# Patient Record
Sex: Female | Born: 1940 | Race: White | Hispanic: No | Smoking: Former smoker
Health system: Southern US, Community
[De-identification: ages and names within clinical notes are randomized; demographics above are authoritative.]

## PROBLEM LIST (undated history)

## (undated) DIAGNOSIS — G2581 Restless legs syndrome: Secondary | ICD-10-CM

## (undated) DIAGNOSIS — I509 Heart failure, unspecified: Secondary | ICD-10-CM

## (undated) DIAGNOSIS — N200 Calculus of kidney: Secondary | ICD-10-CM

## (undated) DIAGNOSIS — I219 Acute myocardial infarction, unspecified: Secondary | ICD-10-CM

## (undated) DIAGNOSIS — M79606 Pain in leg, unspecified: Secondary | ICD-10-CM

## (undated) DIAGNOSIS — I252 Old myocardial infarction: Secondary | ICD-10-CM

## (undated) DIAGNOSIS — I6930 Unspecified sequelae of cerebral infarction: Secondary | ICD-10-CM

## (undated) DIAGNOSIS — I10 Essential (primary) hypertension: Secondary | ICD-10-CM

## (undated) DIAGNOSIS — Z8489 Family history of other specified conditions: Secondary | ICD-10-CM

## (undated) DIAGNOSIS — G8191 Hemiplegia, unspecified affecting right dominant side: Secondary | ICD-10-CM

## (undated) DIAGNOSIS — G8929 Other chronic pain: Secondary | ICD-10-CM

## (undated) DIAGNOSIS — Z955 Presence of coronary angioplasty implant and graft: Secondary | ICD-10-CM

## (undated) DIAGNOSIS — Z87442 Personal history of urinary calculi: Secondary | ICD-10-CM

## (undated) DIAGNOSIS — E119 Type 2 diabetes mellitus without complications: Secondary | ICD-10-CM

## (undated) DIAGNOSIS — R918 Other nonspecific abnormal finding of lung field: Secondary | ICD-10-CM

## (undated) DIAGNOSIS — Z973 Presence of spectacles and contact lenses: Secondary | ICD-10-CM

## (undated) DIAGNOSIS — E782 Mixed hyperlipidemia: Secondary | ICD-10-CM

## (undated) DIAGNOSIS — I639 Cerebral infarction, unspecified: Secondary | ICD-10-CM

## (undated) DIAGNOSIS — G4700 Insomnia, unspecified: Secondary | ICD-10-CM

## (undated) DIAGNOSIS — I2510 Atherosclerotic heart disease of native coronary artery without angina pectoris: Secondary | ICD-10-CM

## (undated) DIAGNOSIS — N2889 Other specified disorders of kidney and ureter: Secondary | ICD-10-CM

## (undated) DIAGNOSIS — M1990 Unspecified osteoarthritis, unspecified site: Secondary | ICD-10-CM

## (undated) DIAGNOSIS — J449 Chronic obstructive pulmonary disease, unspecified: Secondary | ICD-10-CM

## (undated) HISTORY — PX: CARPAL TUNNEL RELEASE: SHX101

## (undated) HISTORY — PX: FRACTURE SURGERY: SHX138

## (undated) HISTORY — PX: DILATION AND CURETTAGE OF UTERUS: SHX78

## (undated) HISTORY — PX: CORONARY ANGIOPLASTY WITH STENT PLACEMENT: SHX49

## (undated) HISTORY — PX: TUBAL LIGATION: SHX77

## (undated) HISTORY — PX: CATARACT EXTRACTION W/ INTRAOCULAR LENS  IMPLANT, BILATERAL: SHX1307

---

## 1962-05-27 HISTORY — PX: CHOLECYSTECTOMY OPEN: SUR202

## 1980-05-27 HISTORY — PX: ABDOMINAL HYSTERECTOMY: SHX81

## 1989-05-27 HISTORY — PX: ORIF ANKLE FRACTURE: SHX5408

## 1997-09-07 ENCOUNTER — Other Ambulatory Visit: Admission: RE | Admit: 1997-09-07 | Discharge: 1997-09-07 | Payer: Self-pay | Admitting: Internal Medicine

## 1997-12-09 ENCOUNTER — Other Ambulatory Visit: Admission: RE | Admit: 1997-12-09 | Discharge: 1997-12-09 | Payer: Self-pay | Admitting: Unknown Physician Specialty

## 1998-12-01 ENCOUNTER — Other Ambulatory Visit: Admission: RE | Admit: 1998-12-01 | Discharge: 1998-12-01 | Payer: Self-pay | Admitting: Internal Medicine

## 1999-05-28 DIAGNOSIS — I252 Old myocardial infarction: Secondary | ICD-10-CM

## 1999-05-28 DIAGNOSIS — I6930 Unspecified sequelae of cerebral infarction: Secondary | ICD-10-CM

## 1999-05-28 DIAGNOSIS — I219 Acute myocardial infarction, unspecified: Secondary | ICD-10-CM

## 1999-05-28 HISTORY — DX: Acute myocardial infarction, unspecified: I21.9

## 1999-05-28 HISTORY — DX: Old myocardial infarction: I25.2

## 1999-05-28 HISTORY — DX: Unspecified sequelae of cerebral infarction: I69.30

## 2000-05-27 DIAGNOSIS — I639 Cerebral infarction, unspecified: Secondary | ICD-10-CM

## 2000-05-27 HISTORY — DX: Cerebral infarction, unspecified: I63.9

## 2000-08-17 ENCOUNTER — Encounter: Payer: Self-pay | Admitting: *Deleted

## 2000-08-17 ENCOUNTER — Inpatient Hospital Stay (HOSPITAL_COMMUNITY): Admission: EM | Admit: 2000-08-17 | Discharge: 2000-08-19 | Payer: Self-pay

## 2000-12-26 ENCOUNTER — Encounter: Payer: Self-pay | Admitting: Orthopedic Surgery

## 2000-12-26 ENCOUNTER — Encounter: Admission: RE | Admit: 2000-12-26 | Discharge: 2000-12-26 | Payer: Self-pay | Admitting: Orthopedic Surgery

## 2001-02-11 ENCOUNTER — Encounter: Admission: RE | Admit: 2001-02-11 | Discharge: 2001-05-12 | Payer: Self-pay | Admitting: Orthopedic Surgery

## 2001-03-12 ENCOUNTER — Other Ambulatory Visit: Admission: RE | Admit: 2001-03-12 | Discharge: 2001-03-12 | Payer: Self-pay | Admitting: Family Medicine

## 2001-03-16 ENCOUNTER — Encounter: Payer: Self-pay | Admitting: Family Medicine

## 2001-03-16 ENCOUNTER — Encounter: Admission: RE | Admit: 2001-03-16 | Discharge: 2001-03-16 | Payer: Self-pay | Admitting: Family Medicine

## 2001-04-05 ENCOUNTER — Encounter: Payer: Self-pay | Admitting: Emergency Medicine

## 2001-04-05 ENCOUNTER — Inpatient Hospital Stay (HOSPITAL_COMMUNITY): Admission: EM | Admit: 2001-04-05 | Discharge: 2001-04-15 | Payer: Self-pay | Admitting: Emergency Medicine

## 2001-04-06 ENCOUNTER — Encounter: Payer: Self-pay | Admitting: Internal Medicine

## 2001-04-07 ENCOUNTER — Encounter: Payer: Self-pay | Admitting: Internal Medicine

## 2001-04-08 ENCOUNTER — Encounter: Payer: Self-pay | Admitting: Internal Medicine

## 2001-05-12 ENCOUNTER — Ambulatory Visit (HOSPITAL_COMMUNITY): Admission: RE | Admit: 2001-05-12 | Discharge: 2001-05-12 | Payer: Self-pay | Admitting: Internal Medicine

## 2001-05-12 ENCOUNTER — Encounter: Payer: Self-pay | Admitting: Internal Medicine

## 2002-01-16 ENCOUNTER — Encounter: Payer: Self-pay | Admitting: Emergency Medicine

## 2002-01-16 ENCOUNTER — Emergency Department (HOSPITAL_COMMUNITY): Admission: EM | Admit: 2002-01-16 | Discharge: 2002-01-16 | Payer: Self-pay | Admitting: Emergency Medicine

## 2002-05-17 ENCOUNTER — Emergency Department (HOSPITAL_COMMUNITY): Admission: EM | Admit: 2002-05-17 | Discharge: 2002-05-17 | Payer: Self-pay | Admitting: Emergency Medicine

## 2002-05-17 ENCOUNTER — Encounter: Payer: Self-pay | Admitting: *Deleted

## 2015-09-25 ENCOUNTER — Institutional Professional Consult (permissible substitution): Payer: Self-pay | Admitting: Pulmonary Disease

## 2015-10-18 ENCOUNTER — Emergency Department (HOSPITAL_COMMUNITY): Payer: Medicare Other

## 2015-10-18 ENCOUNTER — Encounter (HOSPITAL_COMMUNITY): Payer: Self-pay | Admitting: *Deleted

## 2015-10-18 ENCOUNTER — Emergency Department (HOSPITAL_COMMUNITY)
Admission: EM | Admit: 2015-10-18 | Discharge: 2015-10-18 | Disposition: A | Discharge status: Home / Self Care | Payer: Medicare Other | Attending: Emergency Medicine | Admitting: Emergency Medicine

## 2015-10-18 DIAGNOSIS — N201 Calculus of ureter: Secondary | ICD-10-CM | POA: Insufficient documentation

## 2015-10-18 DIAGNOSIS — F329 Major depressive disorder, single episode, unspecified: Secondary | ICD-10-CM | POA: Insufficient documentation

## 2015-10-18 DIAGNOSIS — I10 Essential (primary) hypertension: Secondary | ICD-10-CM | POA: Insufficient documentation

## 2015-10-18 DIAGNOSIS — J449 Chronic obstructive pulmonary disease, unspecified: Secondary | ICD-10-CM | POA: Diagnosis not present

## 2015-10-18 DIAGNOSIS — Z7984 Long term (current) use of oral hypoglycemic drugs: Secondary | ICD-10-CM | POA: Insufficient documentation

## 2015-10-18 DIAGNOSIS — I252 Old myocardial infarction: Secondary | ICD-10-CM | POA: Insufficient documentation

## 2015-10-18 DIAGNOSIS — Z91040 Latex allergy status: Secondary | ICD-10-CM | POA: Diagnosis not present

## 2015-10-18 DIAGNOSIS — Z7982 Long term (current) use of aspirin: Secondary | ICD-10-CM | POA: Insufficient documentation

## 2015-10-18 DIAGNOSIS — E119 Type 2 diabetes mellitus without complications: Secondary | ICD-10-CM | POA: Diagnosis not present

## 2015-10-18 DIAGNOSIS — Z8673 Personal history of transient ischemic attack (TIA), and cerebral infarction without residual deficits: Secondary | ICD-10-CM | POA: Insufficient documentation

## 2015-10-18 DIAGNOSIS — R1031 Right lower quadrant pain: Secondary | ICD-10-CM | POA: Diagnosis present

## 2015-10-18 DIAGNOSIS — I2510 Atherosclerotic heart disease of native coronary artery without angina pectoris: Secondary | ICD-10-CM | POA: Insufficient documentation

## 2015-10-18 HISTORY — DX: Cerebral infarction, unspecified: I63.9

## 2015-10-18 HISTORY — DX: Atherosclerotic heart disease of native coronary artery without angina pectoris: I25.10

## 2015-10-18 HISTORY — DX: Chronic obstructive pulmonary disease, unspecified: J44.9

## 2015-10-18 HISTORY — DX: Insomnia, unspecified: G47.00

## 2015-10-18 HISTORY — DX: Essential (primary) hypertension: I10

## 2015-10-18 LAB — I-STAT CHEM 8, ED
BUN: 20 mg/dL (ref 6–20)
CREATININE: 0.8 mg/dL (ref 0.44–1.00)
Calcium, Ion: 1.13 mmol/L (ref 1.13–1.30)
Chloride: 108 mmol/L (ref 101–111)
Glucose, Bld: 194 mg/dL — ABNORMAL HIGH (ref 65–99)
HEMATOCRIT: 36 % (ref 36.0–46.0)
HEMOGLOBIN: 12.2 g/dL (ref 12.0–15.0)
POTASSIUM: 3.8 mmol/L (ref 3.5–5.1)
SODIUM: 142 mmol/L (ref 135–145)
TCO2: 22 mmol/L (ref 0–100)

## 2015-10-18 LAB — URINALYSIS, ROUTINE W REFLEX MICROSCOPIC
Bilirubin Urine: NEGATIVE
GLUCOSE, UA: 100 mg/dL — AB
KETONES UR: NEGATIVE mg/dL
LEUKOCYTES UA: NEGATIVE
NITRITE: NEGATIVE
PH: 5.5 (ref 5.0–8.0)
Protein, ur: NEGATIVE mg/dL
Specific Gravity, Urine: 1.014 (ref 1.005–1.030)

## 2015-10-18 LAB — CBC WITH DIFFERENTIAL/PLATELET
BASOS ABS: 0 10*3/uL (ref 0.0–0.1)
BASOS PCT: 1 %
Eosinophils Absolute: 0.1 10*3/uL (ref 0.0–0.7)
Eosinophils Relative: 1 %
HEMATOCRIT: 35.6 % — AB (ref 36.0–46.0)
HEMOGLOBIN: 11.6 g/dL — AB (ref 12.0–15.0)
LYMPHS PCT: 25 %
Lymphs Abs: 1.5 10*3/uL (ref 0.7–4.0)
MCH: 28.3 pg (ref 26.0–34.0)
MCHC: 32.6 g/dL (ref 30.0–36.0)
MCV: 86.8 fL (ref 78.0–100.0)
Monocytes Absolute: 0.5 10*3/uL (ref 0.1–1.0)
Monocytes Relative: 8 %
NEUTROS ABS: 4.1 10*3/uL (ref 1.7–7.7)
NEUTROS PCT: 65 %
Platelets: 217 10*3/uL (ref 150–400)
RBC: 4.1 MIL/uL (ref 3.87–5.11)
RDW: 13.5 % (ref 11.5–15.5)
WBC: 6.2 10*3/uL (ref 4.0–10.5)

## 2015-10-18 LAB — URINE MICROSCOPIC-ADD ON

## 2015-10-18 LAB — CBG MONITORING, ED: Glucose-Capillary: 180 mg/dL — ABNORMAL HIGH (ref 65–99)

## 2015-10-18 MED ORDER — HYDROMORPHONE HCL 1 MG/ML IJ SOLN
1.0000 mg | Freq: Once | INTRAMUSCULAR | Status: AC
  Administered 2015-10-18: 1 mg via INTRAVENOUS
  Filled 2015-10-18: qty 1

## 2015-10-18 MED ORDER — HYDROMORPHONE HCL 1 MG/ML IJ SOLN
0.5000 mg | Freq: Once | INTRAMUSCULAR | Status: AC
  Administered 2015-10-18: 0.5 mg via INTRAVENOUS
  Filled 2015-10-18: qty 1

## 2015-10-18 MED ORDER — FENTANYL CITRATE (PF) 100 MCG/2ML IJ SOLN
100.0000 ug | Freq: Once | INTRAMUSCULAR | Status: AC
  Administered 2015-10-18: 100 ug via INTRAVENOUS
  Filled 2015-10-18: qty 2

## 2015-10-18 MED ORDER — KETOROLAC TROMETHAMINE 30 MG/ML IJ SOLN
15.0000 mg | Freq: Once | INTRAMUSCULAR | Status: AC
  Administered 2015-10-18: 15 mg via INTRAVENOUS
  Filled 2015-10-18: qty 1

## 2015-10-18 MED ORDER — ONDANSETRON HCL 4 MG/2ML IJ SOLN
4.0000 mg | Freq: Once | INTRAMUSCULAR | Status: AC
  Administered 2015-10-18: 4 mg via INTRAVENOUS
  Filled 2015-10-18: qty 2

## 2015-10-18 MED ORDER — OXYCODONE-ACETAMINOPHEN 5-325 MG PO TABS: 1.0000 | ORAL_TABLET | Freq: Four times a day (QID) | ORAL | Status: DC | PRN

## 2015-10-18 MED ORDER — IOPAMIDOL (ISOVUE-300) INJECTION 61%
100.0000 mL | Freq: Once | INTRAVENOUS | Status: AC | PRN
  Administered 2015-10-18: 100 mL via INTRAVENOUS

## 2015-10-18 MED ORDER — SODIUM CHLORIDE 0.9 % IV BOLUS (SEPSIS)
500.0000 mL | Freq: Once | INTRAVENOUS | Status: AC
  Administered 2015-10-18: 500 mL via INTRAVENOUS

## 2015-10-18 NOTE — ED Notes (Signed)
Bed: [id number] Expected date:  Expected time:  Means of arrival:  Comments: EMS 75 yo female abdominal and back pain/pain meds 100 mcg Fentanyl

## 2015-10-18 NOTE — Discharge Instructions (Signed)
Kidney Stones °Kidney stones (urolithiasis) are deposits that form inside your kidneys. The intense pain is caused by the stone moving through the urinary tract. When the stone moves, the ureter goes into spasm around the stone. The stone is usually passed in the urine.  °CAUSES  °· A disorder that makes certain neck glands produce too much parathyroid hormone (primary hyperparathyroidism). °· A buildup of uric acid crystals, similar to gout in your joints. °· Narrowing (stricture) of the ureter. °· A kidney obstruction present at birth (congenital obstruction). °· Previous surgery on the kidney or ureters. °· Numerous kidney infections. °SYMPTOMS  °· Feeling sick to your stomach (nauseous). °· Throwing up (vomiting). °· Blood in the urine (hematuria). °· Pain that usually spreads (radiates) to the groin. °· Frequency or urgency of urination. °DIAGNOSIS  °· Taking a history and physical exam. °· Blood or urine tests. °· CT scan. °· Occasionally, an examination of the inside of the urinary bladder (cystoscopy) is performed. °TREATMENT  °· Observation. °· Increasing your fluid intake. °· Extracorporeal shock wave lithotripsy--This is a noninvasive procedure that uses shock waves to break up kidney stones. °· Surgery may be needed if you have severe pain or persistent obstruction. There are various surgical procedures. Most of the procedures are performed with the use of small instruments. Only small incisions are needed to accommodate these instruments, so recovery time is minimized. °The size, location, and chemical composition are all important variables that will determine the proper choice of action for you. Talk to your health care provider to better understand your situation so that you will minimize the risk of injury to yourself and your kidney.  °HOME CARE INSTRUCTIONS  °· Drink enough water and fluids to keep your urine clear or pale yellow. This will help you to pass the stone or stone fragments. °· Strain  all urine through the provided strainer. Keep all particulate matter and stones for your health care provider to see. The stone causing the pain may be as small as a grain of salt. It is very important to use the strainer each and every time you pass your urine. The collection of your stone will allow your health care provider to analyze it and verify that a stone has actually passed. The stone analysis will often identify what you can do to reduce the incidence of recurrences. °· Only take over-the-counter or prescription medicines for pain, discomfort, or fever as directed by your health care provider. °· Keep all follow-up visits as told by your health care provider. This is important. °· Get follow-up X-rays if required. The absence of pain does not always mean that the stone has passed. It may have only stopped moving. If the urine remains completely obstructed, it can cause loss of kidney function or even complete destruction of the kidney. It is your responsibility to make sure X-rays and follow-ups are completed. Ultrasounds of the kidney can show blockages and the status of the kidney. Ultrasounds are not associated with any radiation and can be performed easily in a matter of minutes. °· Make changes to your daily diet as told by your health care provider. You may be told to: °¨ Limit the amount of salt that you eat. °¨ Eat 5 or more servings of fruits and vegetables each day. °¨ Limit the amount of meat, poultry, fish, and eggs that you eat. °· Collect a 24-hour urine sample as told by your health care provider. You may need to collect another urine sample every 6-12   months. °SEEK MEDICAL CARE IF: °· You experience pain that is progressive and unresponsive to any pain medicine you have been prescribed. °SEEK IMMEDIATE MEDICAL CARE IF:  °· Pain cannot be controlled with the prescribed medicine. °· You have a fever or shaking chills. °· The severity or intensity of pain increases over 18 hours and is not  relieved by pain medicine. °· You develop a new onset of abdominal pain. °· You feel faint or pass out. °· You are unable to urinate. °  °This information is not intended to replace advice given to you by your health care provider. Make sure you discuss any questions you have with your health care provider. °  °Document Released: 05/13/2005 Document Revised: 02/01/2015 Document Reviewed: 10/14/2012 °Elsevier Interactive Patient Education ©2016 [employer] ° °

## 2015-10-18 NOTE — ED Provider Notes (Signed)
CSN: [id number]     Arrival date & time 10/18/15  0631 History   First MD Initiated Contact with Patient 10/18/15 [phone]     Chief Complaint  Patient presents with  . Abdominal Pain    RLQ pain     Patient is a 75 y.o. female presenting with abdominal pain. The history is provided by the patient.  Abdominal Pain Associated symptoms: diarrhea, nausea and vomiting   Associated symptoms: no chest pain and no shortness of breath   Patient presents with abdominal pain. Began at around 8 PM last night. It is sharp and in her right lower quadrant. His been constant. She has had nausea with some vomiting. She's had a little bit diarrhea. States she's had some chills but she fever. States she's had some urinary blood. No vaginal bleeding. Previous hysterectomy. States she's been told she has 2 hernias one of the top and one on the bottom on the left side. Pain is constant. Pain improved briefly after throwing up. No previous history of kidney stones.  Past Medical History  Diagnosis Date  . Stroke ([place])   . Diabetes mellitus without complication ([place])   . Hypertension   . Coronary artery disease   . COPD (chronic obstructive pulmonary disease) ([place])   . GERD (gastroesophageal reflux disease)   . MI, old   . Depression   . Insomnia   . Hypercholesteremia    Past Surgical History  Procedure Laterality Date  . Abdominal hysterectomy    . Cholecystectomy    . Carpal tunnel release    . Orif ankle fracture Left    No family history on file. Social History  Substance Use Topics  . Smoking status: None  . Smokeless tobacco: None  . Alcohol Use: None   OB History    No data available     Review of Systems  Constitutional: Positive for appetite change.  Respiratory: Negative for shortness of breath.   Cardiovascular: Negative for chest pain.  Gastrointestinal: Positive for nausea, vomiting, abdominal pain and diarrhea.  Genitourinary: Negative for flank pain.  Musculoskeletal:  Negative for back pain.  Skin: Negative for wound.  Neurological: Negative for headaches.      Allergies  Codeine; Latex; and Penicillins  Home Medications   Prior to Admission medications   Medication Sig Start Date End Date Taking? Authorizing Provider  acetaminophen (TYLENOL) 500 MG tablet Take 1,000 mg by mouth every 6 (six) hours as needed for moderate pain.   Yes Historical Provider, MD  albuterol (VENTOLIN HFA) 108 (90 Base) MCG/ACT inhaler Inhale 2 puffs into the lungs every 4 (four) hours as needed for wheezing or shortness of breath.  08/30/15 08/29/16 Yes Historical Provider, MD  amLODipine (NORVASC) 5 MG tablet Take 1 tablet by mouth daily. 08/30/15  Yes Historical Provider, MD  aspirin 81 MG chewable tablet Chew 1 tablet by mouth daily.   Yes Historical Provider, MD  atorvastatin (LIPITOR) 40 MG tablet Take 1 tablet by mouth at bedtime.   Yes Historical Provider, MD  budesonide-formoterol (SYMBICORT) 160-4.5 MCG/ACT inhaler Inhale 2 puffs into the lungs 2 (two) times daily.   Yes Historical Provider, MD  Cholecalciferol (VITAMIN D3) 5000 units CAPS Take 5,000 Units by mouth daily.  08/30/15  Yes Historical Provider, MD  citalopram (CELEXA) 10 MG tablet Take 10 mg by mouth daily.    Yes Historical Provider, MD  gabapentin (NEURONTIN) 300 MG capsule Take 300-900 mg by mouth 2 (two) times daily. 300 mg in AM  and 900 mg in PM 08/30/15  Yes Historical Provider, MD  HYDROcodone-acetaminophen (NORCO/VICODIN) 5-325 MG tablet Take 1 tablet by mouth 2 (two) times daily. 08/30/15  Yes Historical Provider, MD  lisinopril (PRINIVIL,ZESTRIL) 10 MG tablet Take 10 mg by mouth daily.   Yes Historical Provider, MD  metFORMIN (GLUCOPHAGE) 1000 MG tablet Take 1,000 mg by mouth daily.   Yes Historical Provider, MD  omeprazole (PRILOSEC) 20 MG capsule Take 40 mg by mouth daily.   Yes Historical Provider, MD  ondansetron (ZOFRAN) 4 MG tablet Take 4 mg by mouth at bedtime as needed.   Yes Historical Provider, MD   rOPINIRole (REQUIP) 0.25 MG tablet Take 0.25-0.75 mg by mouth 3 (three) times daily. 0.25 in AM and afternoon, and 0.75 at night   Yes Historical Provider, MD  umeclidinium bromide (INCRUSE ELLIPTA) 62.5 MCG/INH AEPB Inhale 1 puff into the lungs daily.   Yes Historical Provider, MD  oxyCODONE-acetaminophen (PERCOCET/ROXICET) 5-325 MG tablet Take 1-2 tablets by mouth every 6 (six) hours as needed for severe pain. 10/18/15   Davonna Belling, MD   BP 130/72 mmHg  Pulse 72  Temp(Src) 97.8 F (36.6 C) (Oral)  Resp 18  SpO2 87% Physical Exam  Constitutional: She is oriented to person, place, and time. She appears well-developed and well-nourished.  Patient appears uncomfortable  HENT:  Head: Normocephalic.  Eyes: EOM are normal. Pupils are equal, round, and reactive to light.  Cardiovascular: Normal rate, regular rhythm and normal heart sounds.   No murmur heard. Pulmonary/Chest: Effort normal. No respiratory distress.  Abdominal: Soft. She exhibits no distension. There is tenderness. There is no rebound.  Moderate tenderness in right lower quadrant. No hernias palpated. No rash.  Musculoskeletal: She exhibits no edema.  Neurological: She is alert and oriented to person, place, and time.  Skin: Skin is warm and dry.  Psychiatric: Her speech is normal.  Nursing note and vitals reviewed.   ED Course  Procedures (including critical care time) Labs Review Labs Reviewed  CBC WITH DIFFERENTIAL/PLATELET - Abnormal; Notable for the following:    Hemoglobin 11.6 (*)    HCT 35.6 (*)    All other components within normal limits  URINALYSIS, ROUTINE W REFLEX MICROSCOPIC (NOT AT [facility]) - Abnormal; Notable for the following:    Glucose, UA 100 (*)    Hgb urine dipstick LARGE (*)    All other components within normal limits  URINE MICROSCOPIC-ADD ON - Abnormal; Notable for the following:    Squamous Epithelial / LPF 0-5 (*)    Bacteria, UA RARE (*)    All other components within normal limits   I-STAT CHEM 8, ED - Abnormal; Notable for the following:    Glucose, Bld 194 (*)    All other components within normal limits  CBG MONITORING, ED - Abnormal; Notable for the following:    Glucose-Capillary 180 (*)    All other components within normal limits    Imaging Review Ct Abdomen Pelvis W Contrast  10/18/2015  CLINICAL DATA:  Right lower quadrant pain and vaginal bleeding for 1 day, initial encounter EXAM: CT ABDOMEN AND PELVIS WITH CONTRAST TECHNIQUE: Multidetector CT imaging of the abdomen and pelvis was performed using the standard protocol following bolus administration of intravenous contrast. CONTRAST:  170mL ISOVUE-300 IOPAMIDOL (ISOVUE-300) INJECTION 61% COMPARISON:  None FINDINGS: Lung bases are free of acute infiltrate or sizable effusion. The gallbladder has been surgically removed. Mild dilatation of the biliary tree is noted consistent with the post cholecystectomy state. The  spleen, adrenal glands and pancreas are within normal limits. The left kidney is well visualized and demonstrates no renal calculi or urinary tract obstructive changes. The right kidney demonstrates normal enhancement although significant hydronephrosis is noted. This extends to the level of the ureterovesical junction. A 4 mm stone is noted best visualized on the coronal imaging. Significant right perinephric stranding is noted as well. The bladder is partially distended. No other calculi are seen. Aortoiliac calcifications are noted without aneurysmal dilatation. The appendix is well visualized and within normal limits. No significant diverticular change is noted. The uterus has been surgically removed. No free pelvic fluid is seen. No lymphadenopathy is noted. The osseous structures show degenerative change of the lumbar spine. IMPRESSION: 4 mm right UVJ stone with obstructive change and perinephric stranding. Electronically Signed   By: Inez Catalina M.D.   On: 10/18/2015 09:47   Dg Abd Acute  W/chest  10/18/2015  CLINICAL DATA:  New onset LEFT lower quadrant pain last night with nausea and vomiting this morning, congestion, some cough, history COPD, hypertension, diabetes mellitus, coronary artery disease post MI, former smoker EXAM: DG ABDOMEN ACUTE W/ 1V CHEST COMPARISON:  None FINDINGS: Upper normal heart size. Atherosclerotic calcification aorta. Slight pulmonary vascular congestion. No acute infiltrate, pleural effusion or pneumothorax. No nonobstructive bowel gas pattern. Paucity of small bowel gas. No bowel dilatation, bowel wall thickening or free intraperitoneal air. Diffuse osseous demineralization with mild levoconvex scoliosis lumbar spine. Surgical clips in pelvis. Scattered atherosclerotic calcifications. IMPRESSION: No acute abnormalities. Electronically Signed   By: Lavonia Dana M.D.   On: 10/18/2015 07:50   I have personally reviewed and evaluated these images and lab results as part of my medical decision-making.   EKG Interpretation None      MDM   Final diagnoses:  Right ureteral stone    Patient with right-sided flank pain. Found to have 4 mm distal ureteral stone. Labs otherwise reassuring. Feels much better after treatment. Will discharge home with urology follow-up. No sign of infection.    Davonna Belling, MD 10/18/15 1146

## 2015-10-18 NOTE — ED Notes (Signed)
Pt comes from granddaughter home (whom she is staying with) with vaginal bleeding and RLQ pain since yesterday.  Pt sts only bleeding with urination, pt sts bleeding x 3 months.  Pt sts hernias, pt denies any pain from hernia. EMS gave 150 mcg of Fentanyl.  Pt reports hysterectomy in past.  Pt sts this pain is completely different from pervious, sts now clots and pain with bleeding this time.

## 2016-04-23 ENCOUNTER — Emergency Department (HOSPITAL_COMMUNITY): Payer: Medicare Other

## 2016-04-23 ENCOUNTER — Encounter (HOSPITAL_COMMUNITY): Payer: Self-pay | Admitting: Emergency Medicine

## 2016-04-23 ENCOUNTER — Emergency Department (HOSPITAL_COMMUNITY)
Admission: EM | Admit: 2016-04-23 | Discharge: 2016-04-24 | Disposition: A | Discharge status: Home / Self Care | Payer: Medicare Other | Attending: Emergency Medicine | Admitting: Emergency Medicine

## 2016-04-23 DIAGNOSIS — Z7984 Long term (current) use of oral hypoglycemic drugs: Secondary | ICD-10-CM | POA: Insufficient documentation

## 2016-04-23 DIAGNOSIS — M542 Cervicalgia: Secondary | ICD-10-CM

## 2016-04-23 DIAGNOSIS — Z7982 Long term (current) use of aspirin: Secondary | ICD-10-CM | POA: Insufficient documentation

## 2016-04-23 DIAGNOSIS — Z79899 Other long term (current) drug therapy: Secondary | ICD-10-CM | POA: Diagnosis not present

## 2016-04-23 DIAGNOSIS — J449 Chronic obstructive pulmonary disease, unspecified: Secondary | ICD-10-CM | POA: Diagnosis not present

## 2016-04-23 DIAGNOSIS — I252 Old myocardial infarction: Secondary | ICD-10-CM | POA: Diagnosis not present

## 2016-04-23 DIAGNOSIS — I10 Essential (primary) hypertension: Secondary | ICD-10-CM | POA: Diagnosis not present

## 2016-04-23 DIAGNOSIS — Z87891 Personal history of nicotine dependence: Secondary | ICD-10-CM | POA: Diagnosis not present

## 2016-04-23 DIAGNOSIS — I2510 Atherosclerotic heart disease of native coronary artery without angina pectoris: Secondary | ICD-10-CM | POA: Diagnosis not present

## 2016-04-23 DIAGNOSIS — Z8673 Personal history of transient ischemic attack (TIA), and cerebral infarction without residual deficits: Secondary | ICD-10-CM | POA: Insufficient documentation

## 2016-04-23 DIAGNOSIS — Z91040 Latex allergy status: Secondary | ICD-10-CM | POA: Diagnosis not present

## 2016-04-23 MED ORDER — DIAZEPAM 2 MG PO TABS
2.0000 mg | ORAL_TABLET | Freq: Once | ORAL | Status: AC
  Administered 2016-04-23: 2 mg via ORAL
  Filled 2016-04-23: qty 1

## 2016-04-23 MED ORDER — METHOCARBAMOL 1000 MG/10ML IJ SOLN: 500.0000 mg | Freq: Once | INTRAMUSCULAR | Status: DC

## 2016-04-23 MED ORDER — HYDROCODONE-ACETAMINOPHEN 5-325 MG PO TABS: 1.0000 | ORAL_TABLET | Freq: Four times a day (QID) | ORAL | 0 refills | Status: DC | PRN

## 2016-04-23 MED ORDER — FENTANYL CITRATE (PF) 100 MCG/2ML IJ SOLN
50.0000 ug | Freq: Once | INTRAMUSCULAR | Status: AC
  Administered 2016-04-23: 50 ug via INTRAVENOUS
  Filled 2016-04-23: qty 2

## 2016-04-23 MED ORDER — DEXTROSE 5 % IV SOLN
500.0000 mg | Freq: Once | INTRAVENOUS | Status: AC
  Administered 2016-04-23: 500 mg via INTRAVENOUS
  Filled 2016-04-23: qty 550

## 2016-04-23 MED ORDER — METHOCARBAMOL 500 MG PO TABS: 500.0000 mg | ORAL_TABLET | Freq: Two times a day (BID) | ORAL | 0 refills | Status: DC

## 2016-04-23 MED ORDER — OXYCODONE-ACETAMINOPHEN 5-325 MG PO TABS
1.0000 | ORAL_TABLET | Freq: Once | ORAL | Status: AC
  Administered 2016-04-23: 1 via ORAL
  Filled 2016-04-23: qty 1

## 2016-04-23 NOTE — ED Provider Notes (Signed)
Medical screening examination/treatment/procedure(s) were conducted as a shared visit with non-physician practitioner(s) and myself.  I personally evaluated the patient during the encounter.   EKG Interpretation None      Results for orders placed or performed during the hospital encounter of 10/18/15  CBC with Differential  Result Value Ref Range   WBC 6.2 4.0 - 10.5 K/uL   RBC 4.10 3.87 - 5.11 MIL/uL   Hemoglobin 11.6 (L) 12.0 - 15.0 g/dL   HCT 35.6 (L) 36.0 - 46.0 %   MCV 86.8 78.0 - 100.0 fL   MCH 28.3 26.0 - 34.0 pg   MCHC 32.6 30.0 - 36.0 g/dL   RDW 13.5 11.5 - 15.5 %   Platelets 217 150 - 400 K/uL   Neutrophils Relative % 65 %   Neutro Abs 4.1 1.7 - 7.7 K/uL   Lymphocytes Relative 25 %   Lymphs Abs 1.5 0.7 - 4.0 K/uL   Monocytes Relative 8 %   Monocytes Absolute 0.5 0.1 - 1.0 K/uL   Eosinophils Relative 1 %   Eosinophils Absolute 0.1 0.0 - 0.7 K/uL   Basophils Relative 1 %   Basophils Absolute 0.0 0.0 - 0.1 K/uL  Urinalysis, Routine w reflex microscopic  Result Value Ref Range   Color, Urine YELLOW YELLOW   APPearance CLEAR CLEAR   Specific Gravity, Urine 1.014 1.005 - 1.030   pH 5.5 5.0 - 8.0   Glucose, UA 100 (A) NEGATIVE mg/dL   Hgb urine dipstick LARGE (A) NEGATIVE   Bilirubin Urine NEGATIVE NEGATIVE   Ketones, ur NEGATIVE NEGATIVE mg/dL   Protein, ur NEGATIVE NEGATIVE mg/dL   Nitrite NEGATIVE NEGATIVE   Leukocytes, UA NEGATIVE NEGATIVE  Urine microscopic-add on  Result Value Ref Range   Squamous Epithelial / LPF 0-5 (A) NONE SEEN   WBC, UA 0-5 0 - 5 WBC/hpf   RBC / HPF TOO NUMEROUS TO COUNT 0 - 5 RBC/hpf   Bacteria, UA RARE (A) NONE SEEN   Urine-Other YEAST PRESENT   I-stat Chem 8, ED  Result Value Ref Range   Sodium 142 135 - 145 mmol/L   Potassium 3.8 3.5 - 5.1 mmol/L   Chloride 108 101 - 111 mmol/L   BUN 20 6 - 20 mg/dL   Creatinine, Ser 0.80 0.44 - 1.00 mg/dL   Glucose, Bld 194 (H) 65 - 99 mg/dL   Calcium, Ion 1.13 1.13 - 1.30 mmol/L   TCO2 22  0 - 100 mmol/L   Hemoglobin 12.2 12.0 - 15.0 g/dL   HCT 36.0 36.0 - 46.0 %  POC CBG, ED  Result Value Ref Range   Glucose-Capillary 180 (H) 65 - 99 mg/dL   No results found.  Patient seen by me along with physician assistant. Patient with complaint of neck pain left side trapezius location. Suggestive of muscle type spasm. Patient's had no falls or injuries. Patient was out of town for the holidays and she thinks she slept on the neck wrong. She's been trying Motrin and Tylenol at home without any help. Patient with a fair amount of discomfort from it. X-ray of the neck is pending. Patient initially treated with the oral Valium and oral Percocet. If this does not help the pain will switch over to IV meds. Patient neurologically is intact. Patient is alert and oriented. No other acute findings. Abdomen soft lungs are clear. Heart regular rate and rhythm.   Fredia Sorrow, MD 04/23/16 2142

## 2016-04-23 NOTE — ED Triage Notes (Signed)
Patient comes in by EMS from home for neck pain x couple days. Patient denies any fall or injury. Patient was out of town for the holidays and thinks she dlept on neck wrong.

## 2016-04-23 NOTE — Discharge Instructions (Signed)
Try heating pad. Take Norco as prescribed as needed for severe pain. Take Robaxin for muscle spasms. Follow-up with family doctor for recheck. Return if worsening symptoms.

## 2016-04-23 NOTE — ED Provider Notes (Signed)
[facility] DEPT Provider Note   CSN: [id number] Arrival date & time: 04/23/16  [id number]     History   Chief Complaint Chief Complaint  Patient presents with  . Neck Pain    HPI LENELLE BLAKESLEE is a 75 y.o. female.  HPI JERRIANN HAAG is a 75 y.o. female with history of coronary disease, diabetes, depression, COPD, hypertension, CVA, presents to emergency department with complaint of neck pain. Patient states neck pain started 3 days ago. Pain is in the left and posterior neck, radiates all over. Reports pain with movement of the neck. Denies pain radiating down her arms. Denies any numbness or tingling in her extremities. Denies any visual changes. No facial pain. No pain over temples. Denies any neck or head injuries. Denies headache. States history of the same, has had to come to emergency department, was diagnosed with muscle spasms. Patient has tried ibuprofen and Tylenol for pain at home with no relief.  Past Medical History:  Diagnosis Date  . COPD (chronic obstructive pulmonary disease) ([place])   . Coronary artery disease   . Depression   . Diabetes mellitus without complication ([place])   . GERD (gastroesophageal reflux disease)   . Hypercholesteremia   . Hypertension   . Insomnia   . MI, old   . Stroke [facility])     There are no active problems to display for this patient.   Past Surgical History:  Procedure Laterality Date  . ABDOMINAL HYSTERECTOMY    . CARPAL TUNNEL RELEASE    . CHOLECYSTECTOMY    . ORIF ANKLE FRACTURE Left     OB History    No data available       Home Medications    Prior to Admission medications   Medication Sig Start Date End Date Taking? Authorizing Provider  acetaminophen (TYLENOL) 500 MG tablet Take 1,000 mg by mouth every 6 (six) hours as needed for moderate pain.    Historical Provider, MD  albuterol (VENTOLIN HFA) 108 (90 Base) MCG/ACT inhaler Inhale 2 puffs into the lungs every 4 (four) hours as needed for wheezing or shortness of  breath.  08/30/15 08/29/16  Historical Provider, MD  amLODipine (NORVASC) 5 MG tablet Take 1 tablet by mouth daily. 08/30/15   Historical Provider, MD  aspirin 81 MG chewable tablet Chew 1 tablet by mouth daily.    Historical Provider, MD  atorvastatin (LIPITOR) 40 MG tablet Take 1 tablet by mouth at bedtime.    Historical Provider, MD  budesonide-formoterol (SYMBICORT) 160-4.5 MCG/ACT inhaler Inhale 2 puffs into the lungs 2 (two) times daily.    Historical Provider, MD  Cholecalciferol (VITAMIN D3) 5000 units CAPS Take 5,000 Units by mouth daily.  08/30/15   Historical Provider, MD  citalopram (CELEXA) 10 MG tablet Take 10 mg by mouth daily.     Historical Provider, MD  gabapentin (NEURONTIN) 300 MG capsule Take 300-900 mg by mouth 2 (two) times daily. 300 mg in AM and 900 mg in PM 08/30/15   Historical Provider, MD  HYDROcodone-acetaminophen (NORCO/VICODIN) 5-325 MG tablet Take 1 tablet by mouth 2 (two) times daily. 08/30/15   Historical Provider, MD  lisinopril (PRINIVIL,ZESTRIL) 10 MG tablet Take 10 mg by mouth daily.    Historical Provider, MD  metFORMIN (GLUCOPHAGE) 1000 MG tablet Take 1,000 mg by mouth daily.    Historical Provider, MD  omeprazole (PRILOSEC) 20 MG capsule Take 40 mg by mouth daily.    Historical Provider, MD  ondansetron (ZOFRAN) 4 MG tablet Take  4 mg by mouth at bedtime as needed.    Historical Provider, MD  oxyCODONE-acetaminophen (PERCOCET/ROXICET) 5-325 MG tablet Take 1-2 tablets by mouth every 6 (six) hours as needed for severe pain. 10/18/15   Davonna Belling, MD  rOPINIRole (REQUIP) 0.25 MG tablet Take 0.25-0.75 mg by mouth 3 (three) times daily. 0.25 in AM and afternoon, and 0.75 at night    Historical Provider, MD  umeclidinium bromide (INCRUSE ELLIPTA) 62.5 MCG/INH AEPB Inhale 1 puff into the lungs daily.    Historical Provider, MD    Family History No family history on file.  Social History Social History  Substance Use Topics  . Smoking status: Former Research scientist (life sciences)  .  Smokeless tobacco: Not on file  . Alcohol use No     Allergies   Codeine; Latex; and Penicillins   Review of Systems Review of Systems  Constitutional: Negative for chills and fever.  Eyes: Negative for pain and visual disturbance.  Respiratory: Negative for cough, chest tightness and shortness of breath.   Cardiovascular: Negative for chest pain, palpitations and leg swelling.  Genitourinary: Negative for dysuria, flank pain, pelvic pain, vaginal bleeding, vaginal discharge and vaginal pain.  Musculoskeletal: Positive for neck pain and neck stiffness. Negative for arthralgias and myalgias.  Skin: Negative for rash.  Neurological: Negative for dizziness, weakness, light-headedness, numbness and headaches.  All other systems reviewed and are negative.    Physical Exam Updated Vital Signs BP 155/83   Pulse 99   Temp 98.9 F (37.2 C)   Resp 16   SpO2 99%   Physical Exam  Constitutional: She appears well-developed and well-nourished. No distress.  HENT:  Head: Normocephalic.  Eyes: Conjunctivae are normal.  Neck:  Patient is holding her head tilted to the right. Diffuse tenderness to palpation in bilateral trapezius muscles. Pain with range of motion of the neck. No midline tenderness. No tenderness of her bilateral temples. No bruits.  Cardiovascular: Normal rate, regular rhythm and normal heart sounds.   Distal radial pulses are intact and equal bilaterally  Pulmonary/Chest: Effort normal and breath sounds normal. No respiratory distress. She has no wheezes. She has no rales.  Musculoskeletal: She exhibits no edema.  Neurological: She is alert.  5 out of 5 and equal strength of bilateral biceps, triceps, deltoids, grip strength. Sensation intact in all dermatomes of upper extremities.  Skin: Skin is warm and dry. Capillary refill takes less than 2 seconds.  Psychiatric: She has a normal mood and affect. Her behavior is normal.  Nursing note and vitals reviewed.    ED  Treatments / Results  Labs (all labs ordered are listed, but only abnormal results are displayed) Labs Reviewed - No data to display  EKG  EKG Interpretation None       Radiology Dg Cervical Spine Complete  Result Date: 04/23/2016 CLINICAL DATA:  75 y/o  F; 2 days of neck pain with no known injury. EXAM: CERVICAL SPINE - COMPLETE 4+ VIEW COMPARISON:  None. FINDINGS: No acute fracture or malalignment. Discogenic disease with disc space narrowing greatest at C3-4 and C5-6. Upper cervical facet arthrosis. No high-grade bony foraminal narrowing no prevertebral soft tissue abnormality. IMPRESSION: No acute fracture or malalignment. Cervical degenerative changes greatest at the C3-4 and C5-6 levels. Electronically Signed   By: Kristine Garbe M.D.   On: 04/23/2016 21:42    Procedures Procedures (including critical care time)  Medications Ordered in ED Medications  oxyCODONE-acetaminophen (PERCOCET/ROXICET) 5-325 MG per tablet 1 tablet (not administered)  diazepam (VALIUM)  tablet 2 mg (not administered)     Initial Impression / Assessment and Plan / ED Course  I have reviewed the triage vital signs and the nursing notes.  Pertinent labs & imaging results that were available during my care of the patient were reviewed by me and considered in my medical decision making (see chart for details).  Clinical Course   Patient with worsening neck pain, no injuries. No pain radiation. Pain mainly in bilateral trapezius muscles. She is moaning and crying in bed. She is holding her head to the right, unable to straighten it. Will get x-rays, will try Percocet and Valium.  No relief in pain with Percocet and Valium. Will place IV and tried Robaxin and fentanyl.  11:52 PM Pt feels much better after robaxin and fentanyl. She is sitting on side of the bed. Ready "to go home." Most likely muscular pain and spasms. Patient is vastly intact. I do not think this is a vascular or neurological  issues at this time. will dc home with vicodin and robaxin. Follow up closely with pcp.   Vitals:   04/23/16 1829 04/23/16 2225 04/23/16 2330  BP: 155/83 161/71 147/95  Pulse: 99 81 74  Resp: 16 16   Temp: 98.9 F (37.2 C)    SpO2: 99% 91% 91%     Final Clinical Impressions(s) / ED Diagnoses   Final diagnoses:  Neck pain    New Prescriptions New Prescriptions   HYDROCODONE-ACETAMINOPHEN (NORCO) 5-325 MG TABLET    Take 1 tablet by mouth every 6 (six) hours as needed for moderate pain.   METHOCARBAMOL (ROBAXIN) 500 MG TABLET    Take 1 tablet (500 mg total) by mouth 2 (two) times daily.     Jeannett Senior, PA-C 04/24/16 0001    Fredia Sorrow, MD 04/25/16 [phone]

## 2016-07-05 ENCOUNTER — Emergency Department (HOSPITAL_COMMUNITY)
Admission: EM | Admit: 2016-07-05 | Discharge: 2016-07-05 | Disposition: A | Discharge status: Home / Self Care | Payer: Medicare Other | Attending: Emergency Medicine | Admitting: Emergency Medicine

## 2016-07-05 ENCOUNTER — Encounter (HOSPITAL_COMMUNITY): Payer: Self-pay | Admitting: *Deleted

## 2016-07-05 ENCOUNTER — Emergency Department (HOSPITAL_COMMUNITY): Payer: Medicare Other

## 2016-07-05 DIAGNOSIS — I10 Essential (primary) hypertension: Secondary | ICD-10-CM | POA: Diagnosis not present

## 2016-07-05 DIAGNOSIS — Z79899 Other long term (current) drug therapy: Secondary | ICD-10-CM | POA: Insufficient documentation

## 2016-07-05 DIAGNOSIS — I2510 Atherosclerotic heart disease of native coronary artery without angina pectoris: Secondary | ICD-10-CM | POA: Diagnosis not present

## 2016-07-05 DIAGNOSIS — R911 Solitary pulmonary nodule: Secondary | ICD-10-CM | POA: Diagnosis not present

## 2016-07-05 DIAGNOSIS — Z91040 Latex allergy status: Secondary | ICD-10-CM | POA: Diagnosis not present

## 2016-07-05 DIAGNOSIS — R109 Unspecified abdominal pain: Secondary | ICD-10-CM | POA: Diagnosis present

## 2016-07-05 DIAGNOSIS — N2889 Other specified disorders of kidney and ureter: Secondary | ICD-10-CM | POA: Insufficient documentation

## 2016-07-05 DIAGNOSIS — I252 Old myocardial infarction: Secondary | ICD-10-CM | POA: Insufficient documentation

## 2016-07-05 DIAGNOSIS — Z87891 Personal history of nicotine dependence: Secondary | ICD-10-CM | POA: Insufficient documentation

## 2016-07-05 DIAGNOSIS — J449 Chronic obstructive pulmonary disease, unspecified: Secondary | ICD-10-CM | POA: Diagnosis not present

## 2016-07-05 DIAGNOSIS — N23 Unspecified renal colic: Secondary | ICD-10-CM | POA: Diagnosis not present

## 2016-07-05 DIAGNOSIS — Z7982 Long term (current) use of aspirin: Secondary | ICD-10-CM | POA: Insufficient documentation

## 2016-07-05 DIAGNOSIS — R319 Hematuria, unspecified: Secondary | ICD-10-CM | POA: Insufficient documentation

## 2016-07-05 DIAGNOSIS — Z8673 Personal history of transient ischemic attack (TIA), and cerebral infarction without residual deficits: Secondary | ICD-10-CM | POA: Diagnosis not present

## 2016-07-05 LAB — URINALYSIS, ROUTINE W REFLEX MICROSCOPIC
BILIRUBIN URINE: NEGATIVE
Glucose, UA: 150 mg/dL — AB
Ketones, ur: NEGATIVE mg/dL
LEUKOCYTES UA: NEGATIVE
NITRITE: NEGATIVE
PROTEIN: NEGATIVE mg/dL
Specific Gravity, Urine: 1.004 — ABNORMAL LOW (ref 1.005–1.030)
pH: 6 (ref 5.0–8.0)

## 2016-07-05 LAB — CBC WITH DIFFERENTIAL/PLATELET
Basophils Absolute: 0 10*3/uL (ref 0.0–0.1)
Basophils Relative: 0 %
EOS ABS: 0.1 10*3/uL (ref 0.0–0.7)
Eosinophils Relative: 2 %
HCT: 34.6 % — ABNORMAL LOW (ref 36.0–46.0)
HEMOGLOBIN: 11.3 g/dL — AB (ref 12.0–15.0)
LYMPHS ABS: 2.2 10*3/uL (ref 0.7–4.0)
Lymphocytes Relative: 32 %
MCH: 28.5 pg (ref 26.0–34.0)
MCHC: 32.7 g/dL (ref 30.0–36.0)
MCV: 87.4 fL (ref 78.0–100.0)
MONO ABS: 0.4 10*3/uL (ref 0.1–1.0)
MONOS PCT: 6 %
NEUTROS PCT: 60 %
Neutro Abs: 4.1 10*3/uL (ref 1.7–7.7)
Platelets: 206 10*3/uL (ref 150–400)
RBC: 3.96 MIL/uL (ref 3.87–5.11)
RDW: 13.2 % (ref 11.5–15.5)
WBC: 6.9 10*3/uL (ref 4.0–10.5)

## 2016-07-05 LAB — BASIC METABOLIC PANEL
Anion gap: 6 (ref 5–15)
BUN: 20 mg/dL (ref 6–20)
CHLORIDE: 108 mmol/L (ref 101–111)
CO2: 28 mmol/L (ref 22–32)
CREATININE: 0.82 mg/dL (ref 0.44–1.00)
Calcium: 8.9 mg/dL (ref 8.9–10.3)
GFR calc Af Amer: >60 mL/min (ref >60)
GFR calc non Af Amer: >60 mL/min (ref >60)
Glucose, Bld: 166 mg/dL — ABNORMAL HIGH (ref 65–99)
Potassium: 4 mmol/L (ref 3.5–5.1)
SODIUM: 142 mmol/L (ref 135–145)

## 2016-07-05 MED ORDER — ONDANSETRON HCL 4 MG/2ML IJ SOLN
4.0000 mg | Freq: Once | INTRAMUSCULAR | Status: AC
  Administered 2016-07-05: 4 mg via INTRAVENOUS
  Filled 2016-07-05: qty 2

## 2016-07-05 MED ORDER — SODIUM CHLORIDE 0.9 % IV SOLN
Freq: Once | INTRAVENOUS | Status: AC
  Administered 2016-07-05: 18:00:00 via INTRAVENOUS

## 2016-07-05 MED ORDER — HYDROMORPHONE HCL 1 MG/ML IJ SOLN
1.0000 mg | INTRAMUSCULAR | Status: DC | PRN
  Administered 2016-07-05: 1 mg via INTRAVENOUS
  Filled 2016-07-05: qty 1

## 2016-07-05 MED ORDER — OXYCODONE-ACETAMINOPHEN 5-325 MG PO TABS: 2.0000 | ORAL_TABLET | ORAL | 0 refills | Status: DC | PRN

## 2016-07-05 MED ORDER — ONDANSETRON 4 MG PO TBDP: 4.0000 mg | ORAL_TABLET | Freq: Three times a day (TID) | ORAL | 0 refills | Status: DC | PRN

## 2016-07-05 MED ORDER — OXYCODONE-ACETAMINOPHEN 5-325 MG PO TABS
2.0000 | ORAL_TABLET | Freq: Once | ORAL | Status: AC
  Administered 2016-07-05: 2 via ORAL
  Filled 2016-07-05: qty 2

## 2016-07-05 MED ORDER — MORPHINE SULFATE (PF) 4 MG/ML IV SOLN
4.0000 mg | INTRAVENOUS | Status: DC | PRN
  Administered 2016-07-05: 4 mg via INTRAVENOUS
  Filled 2016-07-05: qty 1

## 2016-07-05 NOTE — ED Triage Notes (Signed)
Per EMS, pt complains of right lower abdominal pain since this morning. Pt also complains of dysuria as well. Pt states she had hematuria 3 days ago. Pt also complains of intermittent abdominal pain today.

## 2016-07-05 NOTE — Discharge Instructions (Signed)
No aspirin or anti-inflamatory medications.  Call Dr. Diona Fanti at [facility] urology on Monday. Tell the office that you were in the emergency room, and that Dr. Jeneen Rinks spoke with Dr. Dorina Hoyer to arrange Monday or Tuesday follow-up.  Your CT scan shows a pulmonary nodule. You should have a follow-up x-ray or CT scan through your primary care physician within 3 months to ensure that this is not changing or becoming cancerous.

## 2016-07-05 NOTE — ED Notes (Signed)
Patient transported to CT 

## 2016-07-05 NOTE — ED Provider Notes (Signed)
[facility] DEPT Provider Note   CSN: [id number] Arrival date & time: 07/05/16  1719     History   Chief Complaint Chief Complaint  Patient presents with  . Abdominal Pain    HPI Tina Dyer is a 76 y.o. female. Chief complaint is hematuria, and right flank pain.  HPI:  76 year old female with significant medical history including stroke, CAD, MI, cholesterolemia,DM2, COPD, Htn, GERD. History of kidney stones. Per his of the devastating has had multiple procedures for stones that her life. Last was one year ago. Last intervention was several years ago.  She is not anticoagulated.  She describes sudden onset of right flank pain starting this morning. She had hematuria that is painless 2 or 3 days ago. Sudden onset of pain this morning and crampy intermittent right flank pain rating to her right groin. States it is reminiscent of her previous stones was states it is "much much worse". She had nausea. No vomiting. No recent dysuria.  Recent fevers, chills, weight loss, night sweats, or other B type symptoms.  Past Medical History:  Diagnosis Date  . COPD (chronic obstructive pulmonary disease) ([place])   . Coronary artery disease   . Depression   . Diabetes mellitus without complication ([place])   . GERD (gastroesophageal reflux disease)   . Hypercholesteremia   . Hypertension   . Insomnia   . MI, old   . Stroke [facility])     There are no active problems to display for this patient.   Past Surgical History:  Procedure Laterality Date  . ABDOMINAL HYSTERECTOMY    . CARPAL TUNNEL RELEASE    . CHOLECYSTECTOMY    . ORIF ANKLE FRACTURE Left     OB History    No data available       Home Medications    Prior to Admission medications   Medication Sig Start Date End Date Taking? Authorizing Provider  acetaminophen (TYLENOL) 500 MG tablet Take 1,000 mg by mouth every 6 (six) hours as needed for moderate pain.    Historical Provider, MD  albuterol (VENTOLIN HFA) 108 (90  Base) MCG/ACT inhaler Inhale 2 puffs into the lungs every 4 (four) hours as needed for wheezing or shortness of breath.  08/30/15 08/29/16  Historical Provider, MD  amLODipine (NORVASC) 5 MG tablet Take 1 tablet by mouth daily. 08/30/15   Historical Provider, MD  aspirin 81 MG chewable tablet Chew 1 tablet by mouth daily.    Historical Provider, MD  atorvastatin (LIPITOR) 40 MG tablet Take 1 tablet by mouth at bedtime.    Historical Provider, MD  budesonide-formoterol (SYMBICORT) 160-4.5 MCG/ACT inhaler Inhale 2 puffs into the lungs 2 (two) times daily.    Historical Provider, MD  Cholecalciferol (VITAMIN D3) 5000 units CAPS Take 5,000 Units by mouth daily.  08/30/15   Historical Provider, MD  citalopram (CELEXA) 10 MG tablet Take 10 mg by mouth daily.     Historical Provider, MD  gabapentin (NEURONTIN) 300 MG capsule Take 300-900 mg by mouth 2 (two) times daily. 300 mg in AM and 900 mg in PM 08/30/15   Historical Provider, MD  HYDROcodone-acetaminophen (NORCO) 5-325 MG tablet Take 1 tablet by mouth every 6 (six) hours as needed for moderate pain. 04/23/16   Tatyana Kirichenko, PA-C  lisinopril (PRINIVIL,ZESTRIL) 10 MG tablet Take 10 mg by mouth daily.    Historical Provider, MD  metFORMIN (GLUCOPHAGE) 1000 MG tablet Take 1,000 mg by mouth daily.    Historical Provider, MD  methocarbamol (ROBAXIN)  500 MG tablet Take 1 tablet (500 mg total) by mouth 2 (two) times daily. 04/23/16   Tatyana Kirichenko, PA-C  omeprazole (PRILOSEC) 20 MG capsule Take 40 mg by mouth daily.    Historical Provider, MD  ondansetron (ZOFRAN ODT) 4 MG disintegrating tablet Take 1 tablet (4 mg total) by mouth every 8 (eight) hours as needed for nausea. 07/05/16   Tanna Furry, MD  ondansetron (ZOFRAN) 4 MG tablet Take 4 mg by mouth at bedtime as needed.    Historical Provider, MD  oxyCODONE-acetaminophen (PERCOCET/ROXICET) 5-325 MG tablet Take 2 tablets by mouth every 4 (four) hours as needed. 07/05/16   Tanna Furry, MD  rOPINIRole (REQUIP) 0.25  MG tablet Take 0.25-0.75 mg by mouth 3 (three) times daily. 0.25 in AM and afternoon, and 0.75 at night    Historical Provider, MD  umeclidinium bromide (INCRUSE ELLIPTA) 62.5 MCG/INH AEPB Inhale 1 puff into the lungs daily.    Historical Provider, MD    Family History No family history on file.  Social History Social History  Substance Use Topics  . Smoking status: Former Research scientist (life sciences)  . Smokeless tobacco: Never Used  . Alcohol use No     Allergies   Codeine; Latex; and Penicillins   Review of Systems Review of Systems   Physical Exam Updated Vital Signs BP 135/55   Pulse 68   Temp 97.7 F (36.5 C) (Oral)   Resp 11   SpO2 93%   Physical Exam   ED Treatments / Results  Labs (all labs ordered are listed, but only abnormal results are displayed) Labs Reviewed  CBC WITH DIFFERENTIAL/PLATELET - Abnormal; Notable for the following:       Result Value   Hemoglobin 11.3 (*)    HCT 34.6 (*)    All other components within normal limits  BASIC METABOLIC PANEL - Abnormal; Notable for the following:    Glucose, Bld 166 (*)    All other components within normal limits  URINALYSIS, ROUTINE W REFLEX MICROSCOPIC - Abnormal; Notable for the following:    Color, Urine STRAW (*)    Specific Gravity, Urine 1.004 (*)    Glucose, UA 150 (*)    Hgb urine dipstick LARGE (*)    Bacteria, UA RARE (*)    Squamous Epithelial / LPF 0-5 (*)    All other components within normal limits    EKG  EKG Interpretation None       Radiology Ct Renal Stone Study  Result Date: 07/05/2016 CLINICAL DATA:  Pt complains of right lower abdominal pain since this morning. Pt also complains of dysuria as well. Pt states she had hematuria 3 days ago. Pt also complains of intermittent abdominal pain today. EXAM: CT ABDOMEN AND PELVIS WITHOUT CONTRAST TECHNIQUE: Multidetector CT imaging of the abdomen and pelvis was performed following the standard protocol without IV contrast. COMPARISON:  10/18/2015  FINDINGS: Lower chest: No acute abnormality of the lung bases. 11 mm pulmonary nodule in the left lower lobe. Hepatobiliary: No focal liver abnormality is seen. Status post cholecystectomy. No biliary dilatation. Pancreas: Unremarkable. No pancreatic ductal dilatation or surrounding inflammatory changes. Spleen: Normal in size without focal abnormality. Adrenals/Urinary Tract: Adrenal glands are unremarkable. No hydronephrosis. Nonobstructing right renal calculus. 2.3 x 1.3 cm partially calcified mass like area in the right renal collecting system. No perinephric fluid. Bladder is unremarkable. Stomach/Bowel: Stomach is within normal limits. Appendix appears normal. No evidence of bowel wall thickening, distention, or inflammatory changes. Vascular/Lymphatic: Normal caliber abdominal aorta. Abdominal  aortic atherosclerosis. Reproductive: Status post hysterectomy. No adnexal masses. Other: No abdominal wall hernia or abnormality. No abdominopelvic ascites. Musculoskeletal: No acute osseous abnormality. No lytic or sclerotic osseous lesion. Generalized osteopenia. Bilateral facet arthropathy throughout the lumbar spine most severe at L5-S1. IMPRESSION: 1. 2.3 x 1.3 cm partially calcified mass like area in the right renal collecting system. This may reflect a partially calcified soft tissue mass suggest transitional cell carcinoma versus a partially calcified hematoma. Recommend urology consultation. 2. Nonobstructing right renal calculus. 3. 11 mm pulmonary nodule in the left lower lobe. Recommend follow-up CT in 3 months for further evaluation. This recommendation follows the consensus statement: "Guidelines for Management of Small Pulmonary Nodules Detected on CT Scans: A Statement from the [facility]" as published in Radiology 2005; 237:395-400. Electronically Signed   By: Kathreen Devoid   On: 07/05/2016 18:17    Procedures Procedures (including critical care time)  Medications Ordered in  ED Medications  morphine 4 MG/ML injection 4 mg (4 mg Intravenous Given 07/05/16 1817)  HYDROmorphone (DILAUDID) injection 1 mg (1 mg Intravenous Given 07/05/16 1944)  oxyCODONE-acetaminophen (PERCOCET/ROXICET) 5-325 MG per tablet 2 tablet (not administered)  ondansetron (ZOFRAN) injection 4 mg (4 mg Intravenous Given 07/05/16 1817)  0.9 %  sodium chloride infusion ( Intravenous New Bag/Given 07/05/16 1816)     Initial Impression / Assessment and Plan / ED Course  I have reviewed the triage vital signs and the nursing notes.  Pertinent labs & imaging results that were available during my care of the patient were reviewed by me and considered in my medical decision making (see chart for details).     He is placed. Given IV morphine and Zofran. Symptoms improved but minimally. Given IV Dilaudid and has improved symptomatically control. Has hematuria without signs of infection. Creatinine 0.82. CT scan shows no hydronephrosis. No perinephric fluid. No abdominal lymphadenopathy. Has small pulmonary nodule which will need follow-up. Shows a partially calcified right renal pelvis soft tissue mass. Differential of renal carcinoma versus partially calcified hematoma.  I will discuss further disposition and treatment and diagnostics with urology. I have discussed the findings with patient.  Final Clinical Impressions(s) / ED Diagnoses   Final diagnoses:  Hematuria, unspecified type  Renal mass  Pulmonary nodule  Ureteral colic    I discussed the abnormal CT scan with patient including the renal mass in the pulmonary nodule. I discussed the case with urology, Dr. Preston Fleeting.  Patient will be seen at [facility] urology Monday or Tuesday. She is to call for an appointment. I advised her to contact her primary care physician for outpatient appointment to discuss follow-up chest x-ray regarding her pulmonary nodule   New Prescriptions New Prescriptions   ONDANSETRON (ZOFRAN ODT) 4 MG DISINTEGRATING  TABLET    Take 1 tablet (4 mg total) by mouth every 8 (eight) hours as needed for nausea.   OXYCODONE-ACETAMINOPHEN (PERCOCET/ROXICET) 5-325 MG TABLET    Take 2 tablets by mouth every 4 (four) hours as needed.     Tanna Furry, MD 07/05/16 2046

## 2016-07-17 ENCOUNTER — Institutional Professional Consult (permissible substitution): Payer: Medicare Other | Admitting: Internal Medicine

## 2016-07-18 ENCOUNTER — Institutional Professional Consult (permissible substitution): Payer: Medicare Other | Admitting: Internal Medicine

## 2016-07-26 ENCOUNTER — Institutional Professional Consult (permissible substitution): Payer: Medicare Other | Admitting: Internal Medicine

## 2016-09-21 ENCOUNTER — Encounter (HOSPITAL_COMMUNITY): Payer: Self-pay

## 2016-09-21 ENCOUNTER — Emergency Department (HOSPITAL_COMMUNITY): Payer: Medicare Other

## 2016-09-21 ENCOUNTER — Emergency Department (HOSPITAL_COMMUNITY)
Admission: EM | Admit: 2016-09-21 | Discharge: 2016-09-21 | Disposition: A | Discharge status: Home / Self Care | Payer: Medicare Other | Attending: Emergency Medicine | Admitting: Emergency Medicine

## 2016-09-21 DIAGNOSIS — N2889 Other specified disorders of kidney and ureter: Secondary | ICD-10-CM | POA: Insufficient documentation

## 2016-09-21 DIAGNOSIS — R310 Gross hematuria: Secondary | ICD-10-CM | POA: Diagnosis not present

## 2016-09-21 DIAGNOSIS — I2510 Atherosclerotic heart disease of native coronary artery without angina pectoris: Secondary | ICD-10-CM | POA: Insufficient documentation

## 2016-09-21 DIAGNOSIS — I252 Old myocardial infarction: Secondary | ICD-10-CM | POA: Diagnosis not present

## 2016-09-21 DIAGNOSIS — Z87891 Personal history of nicotine dependence: Secondary | ICD-10-CM | POA: Insufficient documentation

## 2016-09-21 DIAGNOSIS — J449 Chronic obstructive pulmonary disease, unspecified: Secondary | ICD-10-CM | POA: Diagnosis not present

## 2016-09-21 DIAGNOSIS — Z7982 Long term (current) use of aspirin: Secondary | ICD-10-CM | POA: Diagnosis not present

## 2016-09-21 DIAGNOSIS — R319 Hematuria, unspecified: Secondary | ICD-10-CM | POA: Diagnosis present

## 2016-09-21 DIAGNOSIS — Z79899 Other long term (current) drug therapy: Secondary | ICD-10-CM | POA: Insufficient documentation

## 2016-09-21 DIAGNOSIS — Z91040 Latex allergy status: Secondary | ICD-10-CM | POA: Diagnosis not present

## 2016-09-21 DIAGNOSIS — Z8673 Personal history of transient ischemic attack (TIA), and cerebral infarction without residual deficits: Secondary | ICD-10-CM | POA: Diagnosis not present

## 2016-09-21 LAB — COMPREHENSIVE METABOLIC PANEL
ALBUMIN: 3.6 g/dL (ref 3.5–5.0)
ALT: 10 U/L — AB (ref 14–54)
AST: 15 U/L (ref 15–41)
Alkaline Phosphatase: 74 U/L (ref 38–126)
Anion gap: 6 (ref 5–15)
BUN: 14 mg/dL (ref 6–20)
CHLORIDE: 106 mmol/L (ref 101–111)
CO2: 27 mmol/L (ref 22–32)
Calcium: 9 mg/dL (ref 8.9–10.3)
Creatinine, Ser: 0.88 mg/dL (ref 0.44–1.00)
GFR calc Af Amer: >60 mL/min (ref >60)
GFR calc non Af Amer: >60 mL/min (ref >60)
GLUCOSE: 123 mg/dL — AB (ref 65–99)
POTASSIUM: 4.1 mmol/L (ref 3.5–5.1)
Sodium: 139 mmol/L (ref 135–145)
Total Bilirubin: 0.5 mg/dL (ref 0.3–1.2)
Total Protein: 6.4 g/dL — ABNORMAL LOW (ref 6.5–8.1)

## 2016-09-21 LAB — CBC
HEMATOCRIT: 35.4 % — AB (ref 36.0–46.0)
Hemoglobin: 11.8 g/dL — ABNORMAL LOW (ref 12.0–15.0)
MCH: 29 pg (ref 26.0–34.0)
MCHC: 33.3 g/dL (ref 30.0–36.0)
MCV: 87 fL (ref 78.0–100.0)
Platelets: 185 10*3/uL (ref 150–400)
RBC: 4.07 MIL/uL (ref 3.87–5.11)
RDW: 13.3 % (ref 11.5–15.5)
WBC: 4 10*3/uL (ref 4.0–10.5)

## 2016-09-21 LAB — URINALYSIS, ROUTINE W REFLEX MICROSCOPIC
Bacteria, UA: NONE SEEN
SQUAMOUS EPITHELIAL / LPF: NONE SEEN
WBC UA: NONE SEEN WBC/hpf (ref 0–5)

## 2016-09-21 LAB — LIPASE, BLOOD: LIPASE: 27 U/L (ref 11–51)

## 2016-09-21 MED ORDER — HYDROMORPHONE HCL 1 MG/ML IJ SOLN
1.0000 mg | Freq: Once | INTRAMUSCULAR | Status: AC
  Administered 2016-09-21: 1 mg via INTRAVENOUS
  Filled 2016-09-21: qty 1

## 2016-09-21 MED ORDER — ONDANSETRON 4 MG PO TBDP: 4.0000 mg | ORAL_TABLET | Freq: Three times a day (TID) | ORAL | 0 refills | Status: AC | PRN

## 2016-09-21 MED ORDER — SODIUM CHLORIDE 0.9 % IV BOLUS (SEPSIS)
500.0000 mL | Freq: Once | INTRAVENOUS | Status: AC
  Administered 2016-09-21: 500 mL via INTRAVENOUS

## 2016-09-21 MED ORDER — ONDANSETRON HCL 4 MG/2ML IJ SOLN
4.0000 mg | Freq: Once | INTRAMUSCULAR | Status: AC
  Administered 2016-09-21: 4 mg via INTRAVENOUS
  Filled 2016-09-21: qty 2

## 2016-09-21 MED ORDER — MORPHINE SULFATE (PF) 4 MG/ML IV SOLN
4.0000 mg | Freq: Once | INTRAVENOUS | Status: AC
  Administered 2016-09-21: 4 mg via INTRAVENOUS
  Filled 2016-09-21: qty 1

## 2016-09-21 MED ORDER — HYDROCODONE-ACETAMINOPHEN 5-325 MG PO TABS: 1.0000 | ORAL_TABLET | ORAL | 0 refills | Status: DC | PRN

## 2016-09-21 NOTE — ED Triage Notes (Signed)
Pt coming from home with c/o hementaria started yesterday. Pt c/o right side pain with tenders to RUQ and RLQ 10/10. Pt state she had previous hx kidney stone. Pt received fentanyl 100 mcg per EMS.

## 2016-09-21 NOTE — ED Provider Notes (Signed)
[facility] DEPT Provider Note   CSN: [id number] Arrival date & time: 09/21/16  1537     History   Chief Complaint Chief Complaint  Patient presents with  . Abdominal Pain    RLQ  . Hematuria  . Nausea  . Emesis    HPI Tina Dyer is a 76 y.o. female.  HPI  Pt with hx of COPD, CAD, DM, GERD, kidney stones presents with c/o right sided flank and right lower abdominal pain.  Since last visit in 2/18 she states she has had similar pain intermittently, then yesterday began to see gross hematuria again.  She has also had some vomiting yesterday and today associated with the pain.  Pain is sharp, constant.  Pt states it feels the same as her visit in 2/18.  No fever/chills.  She states she has not followed up with urology due to appointment being cancelled, then not having appointment rescheduled. There are no other associated systemic symptoms, there are no other alleviating or modifying factors.   Past Medical History:  Diagnosis Date  . COPD (chronic obstructive pulmonary disease) ([place])   . Coronary artery disease   . Depression   . Diabetes mellitus without complication ([place])   . GERD (gastroesophageal reflux disease)   . Hypercholesteremia   . Hypertension   . Insomnia   . Kidney stone   . MI, old   . Stroke [facility])     There are no active problems to display for this patient.   Past Surgical History:  Procedure Laterality Date  . ABDOMINAL HYSTERECTOMY    . CARPAL TUNNEL RELEASE    . CHOLECYSTECTOMY    . ORIF ANKLE FRACTURE Left     OB History    No data available       Home Medications    Prior to Admission medications   Medication Sig Start Date End Date Taking? Authorizing Provider  albuterol (VENTOLIN HFA) 108 (90 Base) MCG/ACT inhaler Inhale 2 puffs into the lungs every 4 (four) hours as needed for wheezing or shortness of breath.  08/30/15 09/21/16 Yes Historical Provider, MD  amLODipine (NORVASC) 5 MG tablet Take 1 tablet by mouth daily. 08/30/15   Yes Historical Provider, MD  aspirin 81 MG chewable tablet Chew 1 tablet by mouth daily.   Yes Historical Provider, MD  atorvastatin (LIPITOR) 40 MG tablet Take 1 tablet by mouth at bedtime.   Yes Historical Provider, MD  budesonide-formoterol (SYMBICORT) 160-4.5 MCG/ACT inhaler Inhale 2 puffs into the lungs 2 (two) times daily.   Yes Historical Provider, MD  Cholecalciferol (VITAMIN D3) 5000 units CAPS Take 5,000 Units by mouth daily.  08/30/15  Yes Historical Provider, MD  citalopram (CELEXA) 10 MG tablet Take 10 mg by mouth daily.    Yes Historical Provider, MD  gabapentin (NEURONTIN) 300 MG capsule Take 900 mg by mouth at bedtime. 300 mg in AM and 900 mg in PM 08/30/15  Yes Historical Provider, MD  lisinopril (PRINIVIL,ZESTRIL) 10 MG tablet Take 10 mg by mouth daily.   Yes Historical Provider, MD  metFORMIN (GLUCOPHAGE) 1000 MG tablet Take 1,000 mg by mouth daily.   Yes Historical Provider, MD  omeprazole (PRILOSEC) 20 MG capsule Take 40 mg by mouth daily.   Yes Historical Provider, MD  rOPINIRole (REQUIP) 0.25 MG tablet Take 0.5-0.75 mg by mouth 2 (two) times daily. 0.5 mg in the afternoon, and 0.75 at night   Yes Historical Provider, MD  umeclidinium bromide (INCRUSE ELLIPTA) 62.5 MCG/INH AEPB Inhale 1  puff into the lungs daily.   Yes Historical Provider, MD  HYDROcodone-acetaminophen (NORCO/VICODIN) 5-325 MG tablet Take 1 tablet by mouth every 4 (four) hours as needed. 09/21/16   Alfonzo Beers, MD  methocarbamol (ROBAXIN) 500 MG tablet Take 1 tablet (500 mg total) by mouth 2 (two) times daily. Patient not taking: Reported on 09/21/2016 04/23/16   Tatyana Kirichenko, PA-C  ondansetron (ZOFRAN ODT) 4 MG disintegrating tablet Take 1 tablet (4 mg total) by mouth every 8 (eight) hours as needed for nausea or vomiting. 09/21/16   Alfonzo Beers, MD  oxyCODONE-acetaminophen (PERCOCET/ROXICET) 5-325 MG tablet Take 2 tablets by mouth every 4 (four) hours as needed. Patient not taking: Reported on 09/21/2016  07/05/16   Tanna Furry, MD    Family History No family history on file.  Social History Social History  Substance Use Topics  . Smoking status: Former Research scientist (life sciences)  . Smokeless tobacco: Never Used  . Alcohol use No     Allergies   Sulfa antibiotics; Codeine; Latex; and Penicillins   Review of Systems Review of Systems  ROS reviewed and all otherwise negative except for mentioned in HPI   Physical Exam Updated Vital Signs BP 135/72   Pulse 64   Temp 97.8 F (36.6 C) (Oral)   Resp 16   Ht 5\' 5"  (1.651 m)   Wt 160 lb (72.6 kg)   SpO2 97%   BMI 26.63 kg/m  Vitals reviewed Physical Exam Physical Examination: General appearance - alert, uncomfortable appearing, and in no distress Mental status - alert, oriented to person, place, and time Eyes - no conjunctival injection, no scleral icterus Mouth - mucous membranes moist, pharynx normal without lesions Neck - supple, no significant adenopathy Chest - clear to auscultation, no wheezes, rales or rhonchi, symmetric air entry Heart - normal rate, regular rhythm, normal S1, S2, no murmurs, rubs, clicks or gallops Abdomen - soft, ttp over right mid and lower abdomen,  nondistended, no masses or organomegaly Back exam - no midline back pain, no CVA tenderness Neurological - alert, oriented, normal speech, awake, moving all extremities Extremities - peripheral pulses normal, no pedal edema, no clubbing or cyanosis Skin - normal coloration and turgor, no rashes  ED Treatments / Results  Labs (all labs ordered are listed, but only abnormal results are displayed) Labs Reviewed  URINALYSIS, ROUTINE W REFLEX MICROSCOPIC - Abnormal; Notable for the following:       Result Value   Color, Urine RED (*)    APPearance TURBID (*)    Glucose, UA   (*)    Value: TEST NOT REPORTED DUE TO COLOR INTERFERENCE OF URINE PIGMENT   Hgb urine dipstick   (*)    Value: TEST NOT REPORTED DUE TO COLOR INTERFERENCE OF URINE PIGMENT   Bilirubin Urine    (*)    Value: TEST NOT REPORTED DUE TO COLOR INTERFERENCE OF URINE PIGMENT   Ketones, ur   (*)    Value: TEST NOT REPORTED DUE TO COLOR INTERFERENCE OF URINE PIGMENT   Protein, ur   (*)    Value: TEST NOT REPORTED DUE TO COLOR INTERFERENCE OF URINE PIGMENT   Nitrite   (*)    Value: TEST NOT REPORTED DUE TO COLOR INTERFERENCE OF URINE PIGMENT   Leukocytes, UA   (*)    Value: TEST NOT REPORTED DUE TO COLOR INTERFERENCE OF URINE PIGMENT   All other components within normal limits  COMPREHENSIVE METABOLIC PANEL - Abnormal; Notable for the following:    Glucose,  Bld 123 (*)    Total Protein 6.4 (*)    ALT 10 (*)    All other components within normal limits  CBC - Abnormal; Notable for the following:    Hemoglobin 11.8 (*)    HCT 35.4 (*)    All other components within normal limits  LIPASE, BLOOD    EKG  EKG Interpretation None       Radiology Ct Renal Stone Study  Result Date: 09/21/2016 CLINICAL DATA:  Hematuria.  Right-sided pain and tenderness. EXAM: CT ABDOMEN AND PELVIS WITHOUT CONTRAST TECHNIQUE: Multidetector CT imaging of the abdomen and pelvis was performed following the standard protocol without IV contrast. COMPARISON:  July 05, 2016 FINDINGS: Lower chest: The nodule in the lateral left lung base measures 9 mm today versus 12 mm previously. No acute changes are seen in the lungs. Coronary artery calcifications identified. Hepatobiliary: No focal liver abnormality is seen. Status post cholecystectomy. No biliary dilatation. Pancreas: Unremarkable. No pancreatic ductal dilatation or surrounding inflammatory changes. Spleen: Normal in size without focal abnormality. Adrenals/Urinary Tract: No stones, hydronephrosis, or acute perinephric stranding seen on the left. A stone in the right lower kidney is larger in the interval now measuring up to 7.8 mm. Another tiny stone is seen more centrally on coronal image 76. Peripheral calcification is identified in an expanded  infundibulum, best seen on coronal image 78 measuring up to 2.2 cm. No hydronephrosis or acute perinephric stranding on the right. No ureterectasis or ureteral stones. The bladder is normal. Stomach/Bowel: Stomach is within normal limits. Appendix appears normal. No evidence of bowel wall thickening, distention, or inflammatory changes. Vascular/Lymphatic: Atherosclerosis is seen in the non aneurysmal abdominal aorta, iliac vessels, and femoral vessels. No adenopathy is identified. Reproductive: Status post hysterectomy. No adnexal masses. Other: There is a fat containing left inguinal hernia. A fat containing ventral hernia is identified as well, small. Musculoskeletal: No acute or significant osseous findings. IMPRESSION: 1. There is a peripherally calcified masslike abnormality in an expanded right infundibulum. This could represent a neoplasm. The previous study also suggested a peripherally calcified hematoma which is possible. A mostly radiolucent stone with peripheral calcification is possible but less likely. Recommend urologic consultation. 2. Nonobstructive stones in the right kidney. 3. Atherosclerosis. Electronically Signed   By: Dorise Bullion III M.D   On: 09/21/2016 18:21    Procedures Procedures (including critical care time)  Medications Ordered in ED Medications  morphine 4 MG/ML injection 4 mg (4 mg Intravenous Given 09/21/16 1646)  ondansetron (ZOFRAN) injection 4 mg (4 mg Intravenous Given 09/21/16 1646)  HYDROmorphone (DILAUDID) injection 1 mg (1 mg Intravenous Given 09/21/16 1936)  sodium chloride 0.9 % bolus 500 mL (0 mLs Intravenous Stopped 09/21/16 2052)     Initial Impression / Assessment and Plan / ED Course  I have reviewed the triage vital signs and the nursing notes.  Pertinent labs & imaging results that were available during my care of the patient were reviewed by me and considered in my medical decision making (see chart for details).    8:19 PM d/w Dr. Tresa Moore,  urology, he recommends strongly encouraging patient that she definitely needs to followup with urology.  He will send a message to his scheduler as well.    Pt presenting with right sided abdominal pain with gross hematuria- CT scan shows peristent mass in right renal collecting system.  D/w urology as above.  Pain and nausea controlled with meds as above. Discharged with strict return precautions.  Pt  agreeable with plan.  Final Clinical Impressions(s) / ED Diagnoses   Final diagnoses:  Renal mass, right  Gross hematuria    New Prescriptions Discharge Medication List as of 09/21/2016  8:25 PM       Alfonzo Beers, MD 09/22/16 [phone]

## 2016-09-21 NOTE — Discharge Instructions (Signed)
Return to the ED with any concerns including vomiting and not able to keep down liquids, fever/chills, not able to urinate, or any other alarming symptoms   IT IS VERY IMPORTANT THAT YOU CALL [facility] [place], THERE IS A MASS IN YOUR KIDNEY THAT NEEDS TO BE SEEN BY A UROLOGIST.

## 2016-09-21 NOTE — ED Notes (Signed)
ED Provider at bedside. 

## 2016-09-21 NOTE — ED Notes (Signed)
Bed: ES92 Expected date: 09/21/16 Expected time: 3:21 PM Means of arrival: Ambulance Comments: abd pain and hematuria

## 2016-09-30 ENCOUNTER — Other Ambulatory Visit: Payer: Self-pay | Admitting: Urology

## 2016-10-03 ENCOUNTER — Encounter (HOSPITAL_BASED_OUTPATIENT_CLINIC_OR_DEPARTMENT_OTHER): Payer: Self-pay | Admitting: *Deleted

## 2016-10-10 ENCOUNTER — Encounter (HOSPITAL_BASED_OUTPATIENT_CLINIC_OR_DEPARTMENT_OTHER): Payer: Self-pay | Admitting: *Deleted

## 2016-10-10 NOTE — Progress Notes (Signed)
SPOKE W/ PT'S GRANDDAUGHTER, BRITTANY, WHOM WILL BE HERE DOS.  NPO AFTER MN.  ARRIVE AT 0915.  NEEDS ISTAT 8 AND EKG.  WILL TAKE AM MEDS DOS W/ SIPS OF WATER WITH EXCEPTION NO LISINOPRIL.  PT WALKS W/ WALKER DUE TO RIGHT HEMIPLEGIA.

## 2016-10-11 ENCOUNTER — Encounter (HOSPITAL_BASED_OUTPATIENT_CLINIC_OR_DEPARTMENT_OTHER): Payer: Self-pay | Admitting: *Deleted

## 2016-10-11 ENCOUNTER — Ambulatory Visit (HOSPITAL_BASED_OUTPATIENT_CLINIC_OR_DEPARTMENT_OTHER): Payer: Medicare Other | Admitting: Anesthesiology

## 2016-10-11 ENCOUNTER — Ambulatory Visit (HOSPITAL_BASED_OUTPATIENT_CLINIC_OR_DEPARTMENT_OTHER)
Admission: RE | Admit: 2016-10-11 | Discharge: 2016-10-11 | Disposition: A | Discharge status: Home / Self Care | Payer: Medicare Other | Source: Ambulatory Visit | Attending: Urology | Admitting: Urology

## 2016-10-11 ENCOUNTER — Encounter (HOSPITAL_BASED_OUTPATIENT_CLINIC_OR_DEPARTMENT_OTHER)
Admission: RE | Disposition: A | Discharge status: Home / Self Care | Payer: Self-pay | Source: Ambulatory Visit | Attending: Urology

## 2016-10-11 DIAGNOSIS — Z91040 Latex allergy status: Secondary | ICD-10-CM | POA: Insufficient documentation

## 2016-10-11 DIAGNOSIS — Z885 Allergy status to narcotic agent status: Secondary | ICD-10-CM | POA: Insufficient documentation

## 2016-10-11 DIAGNOSIS — Z90722 Acquired absence of ovaries, bilateral: Secondary | ICD-10-CM | POA: Insufficient documentation

## 2016-10-11 DIAGNOSIS — I10 Essential (primary) hypertension: Secondary | ICD-10-CM | POA: Diagnosis not present

## 2016-10-11 DIAGNOSIS — F329 Major depressive disorder, single episode, unspecified: Secondary | ICD-10-CM | POA: Insufficient documentation

## 2016-10-11 DIAGNOSIS — R1900 Intra-abdominal and pelvic swelling, mass and lump, unspecified site: Secondary | ICD-10-CM | POA: Diagnosis present

## 2016-10-11 DIAGNOSIS — G8929 Other chronic pain: Secondary | ICD-10-CM | POA: Insufficient documentation

## 2016-10-11 DIAGNOSIS — E119 Type 2 diabetes mellitus without complications: Secondary | ICD-10-CM | POA: Diagnosis not present

## 2016-10-11 DIAGNOSIS — I2510 Atherosclerotic heart disease of native coronary artery without angina pectoris: Secondary | ICD-10-CM | POA: Diagnosis not present

## 2016-10-11 DIAGNOSIS — Z880 Allergy status to penicillin: Secondary | ICD-10-CM | POA: Insufficient documentation

## 2016-10-11 DIAGNOSIS — Z87891 Personal history of nicotine dependence: Secondary | ICD-10-CM | POA: Insufficient documentation

## 2016-10-11 DIAGNOSIS — Z87442 Personal history of urinary calculi: Secondary | ICD-10-CM | POA: Diagnosis not present

## 2016-10-11 DIAGNOSIS — M792 Neuralgia and neuritis, unspecified: Secondary | ICD-10-CM | POA: Insufficient documentation

## 2016-10-11 DIAGNOSIS — E782 Mixed hyperlipidemia: Secondary | ICD-10-CM | POA: Diagnosis not present

## 2016-10-11 DIAGNOSIS — I252 Old myocardial infarction: Secondary | ICD-10-CM | POA: Insufficient documentation

## 2016-10-11 DIAGNOSIS — N200 Calculus of kidney: Secondary | ICD-10-CM | POA: Diagnosis present

## 2016-10-11 DIAGNOSIS — G4700 Insomnia, unspecified: Secondary | ICD-10-CM | POA: Insufficient documentation

## 2016-10-11 DIAGNOSIS — M79604 Pain in right leg: Secondary | ICD-10-CM | POA: Insufficient documentation

## 2016-10-11 DIAGNOSIS — Z7984 Long term (current) use of oral hypoglycemic drugs: Secondary | ICD-10-CM | POA: Insufficient documentation

## 2016-10-11 DIAGNOSIS — J449 Chronic obstructive pulmonary disease, unspecified: Secondary | ICD-10-CM | POA: Diagnosis not present

## 2016-10-11 DIAGNOSIS — I69351 Hemiplegia and hemiparesis following cerebral infarction affecting right dominant side: Secondary | ICD-10-CM | POA: Diagnosis not present

## 2016-10-11 DIAGNOSIS — Z79899 Other long term (current) drug therapy: Secondary | ICD-10-CM | POA: Insufficient documentation

## 2016-10-11 DIAGNOSIS — Z955 Presence of coronary angioplasty implant and graft: Secondary | ICD-10-CM | POA: Diagnosis not present

## 2016-10-11 DIAGNOSIS — R310 Gross hematuria: Secondary | ICD-10-CM | POA: Diagnosis present

## 2016-10-11 DIAGNOSIS — Z7951 Long term (current) use of inhaled steroids: Secondary | ICD-10-CM | POA: Insufficient documentation

## 2016-10-11 DIAGNOSIS — Z9889 Other specified postprocedural states: Secondary | ICD-10-CM | POA: Insufficient documentation

## 2016-10-11 DIAGNOSIS — D0919 Carcinoma in situ of other urinary organs: Secondary | ICD-10-CM | POA: Insufficient documentation

## 2016-10-11 DIAGNOSIS — K219 Gastro-esophageal reflux disease without esophagitis: Secondary | ICD-10-CM | POA: Insufficient documentation

## 2016-10-11 DIAGNOSIS — Z90710 Acquired absence of both cervix and uterus: Secondary | ICD-10-CM | POA: Insufficient documentation

## 2016-10-11 DIAGNOSIS — Z9842 Cataract extraction status, left eye: Secondary | ICD-10-CM | POA: Insufficient documentation

## 2016-10-11 DIAGNOSIS — Z9079 Acquired absence of other genital organ(s): Secondary | ICD-10-CM | POA: Insufficient documentation

## 2016-10-11 DIAGNOSIS — Z7982 Long term (current) use of aspirin: Secondary | ICD-10-CM | POA: Diagnosis not present

## 2016-10-11 DIAGNOSIS — Z961 Presence of intraocular lens: Secondary | ICD-10-CM | POA: Insufficient documentation

## 2016-10-11 DIAGNOSIS — Z882 Allergy status to sulfonamides status: Secondary | ICD-10-CM | POA: Insufficient documentation

## 2016-10-11 DIAGNOSIS — Z9841 Cataract extraction status, right eye: Secondary | ICD-10-CM | POA: Insufficient documentation

## 2016-10-11 HISTORY — DX: Other nonspecific abnormal finding of lung field: R91.8

## 2016-10-11 HISTORY — DX: Presence of coronary angioplasty implant and graft: Z95.5

## 2016-10-11 HISTORY — DX: Other specified disorders of kidney and ureter: N28.89

## 2016-10-11 HISTORY — DX: Old myocardial infarction: I25.2

## 2016-10-11 HISTORY — DX: Unspecified sequelae of cerebral infarction: I69.30

## 2016-10-11 HISTORY — DX: Other chronic pain: G89.29

## 2016-10-11 HISTORY — DX: Personal history of urinary calculi: Z87.442

## 2016-10-11 HISTORY — DX: Mixed hyperlipidemia: E78.2

## 2016-10-11 HISTORY — DX: Presence of spectacles and contact lenses: Z97.3

## 2016-10-11 HISTORY — DX: Pain in leg, unspecified: M79.606

## 2016-10-11 HISTORY — PX: CYSTOSCOPY WITH RETROGRADE PYELOGRAM, URETEROSCOPY AND STENT PLACEMENT: SHX5789

## 2016-10-11 HISTORY — DX: Calculus of kidney: N20.0

## 2016-10-11 HISTORY — DX: Type 2 diabetes mellitus without complications: E11.9

## 2016-10-11 HISTORY — DX: Hemiplegia, unspecified affecting right dominant side: G81.91

## 2016-10-11 LAB — POCT I-STAT, CHEM 8
BUN: 23 mg/dL — ABNORMAL HIGH (ref 6–20)
CREATININE: 0.8 mg/dL (ref 0.44–1.00)
Calcium, Ion: 1.22 mmol/L (ref 1.15–1.40)
Chloride: 106 mmol/L (ref 101–111)
GLUCOSE: 111 mg/dL — AB (ref 65–99)
HCT: 33 % — ABNORMAL LOW (ref 36.0–46.0)
HEMOGLOBIN: 11.2 g/dL — AB (ref 12.0–15.0)
POTASSIUM: 4 mmol/L (ref 3.5–5.1)
Sodium: 144 mmol/L (ref 135–145)
TCO2: 27 mmol/L (ref 0–100)

## 2016-10-11 LAB — GLUCOSE, CAPILLARY: Glucose-Capillary: 130 mg/dL — ABNORMAL HIGH (ref 65–99)

## 2016-10-11 SURGERY — CYSTOURETEROSCOPY, WITH RETROGRADE PYELOGRAM AND STENT INSERTION
Anesthesia: General | Laterality: Right

## 2016-10-11 MED ORDER — DEXAMETHASONE SODIUM PHOSPHATE 4 MG/ML IJ SOLN
INTRAMUSCULAR | Status: DC | PRN
  Administered 2016-10-11: 10 mg via INTRAVENOUS

## 2016-10-11 MED ORDER — LIDOCAINE 2% (20 MG/ML) 5 ML SYRINGE
INTRAMUSCULAR | Status: AC
  Filled 2016-10-11: qty 5

## 2016-10-11 MED ORDER — ONDANSETRON HCL 4 MG/2ML IJ SOLN
INTRAMUSCULAR | Status: DC | PRN
  Administered 2016-10-11: 4 mg via INTRAVENOUS

## 2016-10-11 MED ORDER — EPHEDRINE 5 MG/ML INJ
INTRAVENOUS | Status: AC
  Filled 2016-10-11: qty 10

## 2016-10-11 MED ORDER — FENTANYL CITRATE (PF) 100 MCG/2ML IJ SOLN
INTRAMUSCULAR | Status: DC | PRN
Start: 2016-10-11 — End: 2016-10-11
  Administered 2016-10-11: 50 ug via INTRAVENOUS

## 2016-10-11 MED ORDER — FENTANYL CITRATE (PF) 100 MCG/2ML IJ SOLN
INTRAMUSCULAR | Status: AC
  Filled 2016-10-11: qty 2

## 2016-10-11 MED ORDER — GENTAMICIN SULFATE 40 MG/ML IJ SOLN
5.0000 mg/kg | INTRAVENOUS | Status: DC
  Filled 2016-10-11: qty 9

## 2016-10-11 MED ORDER — MEPERIDINE HCL 25 MG/ML IJ SOLN
6.2500 mg | INTRAMUSCULAR | Status: DC | PRN
  Filled 2016-10-11: qty 1

## 2016-10-11 MED ORDER — TRAMADOL HCL 50 MG PO TABS: 50.0000 mg | ORAL_TABLET | Freq: Four times a day (QID) | ORAL | 0 refills | Status: DC | PRN

## 2016-10-11 MED ORDER — DEXAMETHASONE SODIUM PHOSPHATE 10 MG/ML IJ SOLN
INTRAMUSCULAR | Status: AC
  Filled 2016-10-11: qty 1

## 2016-10-11 MED ORDER — ONDANSETRON HCL 4 MG/2ML IJ SOLN
INTRAMUSCULAR | Status: AC
  Filled 2016-10-11: qty 2

## 2016-10-11 MED ORDER — LIDOCAINE 2% (20 MG/ML) 5 ML SYRINGE
INTRAMUSCULAR | Status: DC | PRN
  Administered 2016-10-11: 60 mg via INTRAVENOUS

## 2016-10-11 MED ORDER — HYDROMORPHONE HCL 1 MG/ML IJ SOLN
0.2500 mg | INTRAMUSCULAR | Status: DC | PRN
  Filled 2016-10-11: qty 0.5

## 2016-10-11 MED ORDER — ONDANSETRON HCL 4 MG/2ML IJ SOLN
4.0000 mg | Freq: Once | INTRAMUSCULAR | Status: DC | PRN
  Filled 2016-10-11: qty 2

## 2016-10-11 MED ORDER — PROPOFOL 10 MG/ML IV BOLUS
INTRAVENOUS | Status: AC
  Filled 2016-10-11: qty 20

## 2016-10-11 MED ORDER — IOHEXOL 300 MG/ML  SOLN
INTRAMUSCULAR | Status: DC | PRN
  Administered 2016-10-11: 17 mL via URETHRAL

## 2016-10-11 MED ORDER — PROPOFOL 10 MG/ML IV BOLUS
INTRAVENOUS | Status: DC | PRN
  Administered 2016-10-11: 120 mg via INTRAVENOUS

## 2016-10-11 MED ORDER — GENTAMICIN SULFATE 40 MG/ML IJ SOLN
5.0000 mg/kg | INTRAMUSCULAR | Status: AC
  Administered 2016-10-11: 370 mg via INTRAVENOUS
  Filled 2016-10-11: qty 9.25

## 2016-10-11 MED ORDER — LACTATED RINGERS IV SOLN
INTRAVENOUS | Status: DC
  Administered 2016-10-11: 10:00:00 via INTRAVENOUS
  Filled 2016-10-11: qty 1000

## 2016-10-11 SURGICAL SUPPLY — 29 items
BAG DRAIN URO-CYSTO SKYTR STRL (DRAIN) ×3 IMPLANT
BAG DRN UROCATH (DRAIN) ×1
BASKET DAKOTA 1.9FR 11X120 (BASKET) IMPLANT
BASKET LASER NITINOL 1.9FR (BASKET) ×3 IMPLANT
BASKET ZERO TIP NITINOL 2.4FR (BASKET) IMPLANT
BSKT STON RTRVL 120 1.9FR (BASKET) ×1
BSKT STON RTRVL ZERO TP 2.4FR (BASKET)
CATH INTERMIT  6FR 70CM (CATHETERS) ×3 IMPLANT
CLOTH BEACON ORANGE TIMEOUT ST (SAFETY) ×3 IMPLANT
FIBER LASER FLEXIVA 365 (UROLOGICAL SUPPLIES) IMPLANT
FIBER LASER TRAC TIP (UROLOGICAL SUPPLIES) IMPLANT
GLOVE BIO SURGEON STRL SZ7.5 (GLOVE) ×3 IMPLANT
GOWN STRL REUS W/ TWL LRG LVL3 (GOWN DISPOSABLE) ×2 IMPLANT
GOWN STRL REUS W/TWL LRG LVL3 (GOWN DISPOSABLE) ×6
GUIDEWIRE ANG ZIPWIRE 038X150 (WIRE) ×3 IMPLANT
GUIDEWIRE STR DUAL SENSOR (WIRE) ×3 IMPLANT
IV NS 1000ML (IV SOLUTION) ×3
IV NS 1000ML BAXH (IV SOLUTION) ×1 IMPLANT
IV NS IRRIG 3000ML ARTHROMATIC (IV SOLUTION) ×3 IMPLANT
KIT RM TURNOVER CYSTO AR (KITS) ×3 IMPLANT
MANIFOLD NEPTUNE II (INSTRUMENTS) ×3 IMPLANT
NS IRRIG 500ML POUR BTL (IV SOLUTION) ×3 IMPLANT
PACK CYSTO (CUSTOM PROCEDURE TRAY) ×3 IMPLANT
SHEATH ACCESS URETERAL 24CM (SHEATH) ×3 IMPLANT
STENT URET 6FRX24 CONTOUR (STENTS) ×3 IMPLANT
SYRINGE 10CC LL (SYRINGE) ×3 IMPLANT
TUBE CONNECTING 12'X1/4 (SUCTIONS)
TUBE CONNECTING 12X1/4 (SUCTIONS) IMPLANT
TUBE FEEDING 8FR 16IN STR KANG (MISCELLANEOUS) IMPLANT

## 2016-10-11 NOTE — H&P (Signed)
Tina Dyer is an 76 y.o. female.    Chief Complaint: Pre-op RIGHT Ureteroscopy / Biopsy, Cysto and Bilateral retrogrades  HPI:   1 - Gross Hematuria - new gross hematuria / right flank pain 06/2016. Non-con CT with Rt renal pelvis mass as per below. Cr 0.8. UA without infectious parameters. 1 artery (superior), 2 vein (gonadal inserts into lower) right renovascular anatomy.   2 - Right Renal Pelvis Mass - Rt upper pole calcified mass are in what appears to be collecting systsm of right kidney.   3 - Nephrolithiasis - small Rr lower pole non-obstructing stone (23mm) by CT 06/2016 at time of hematuria. No contralateral stones.   PMH sig for CAD/MI, CVA (Rt sided paressi), COPD (no O2, no longer smokes), DM2, TAH, Open Chole, Ortho Surgery. She lives with her grand-daughter who is very involved.   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 NO interval fevers. Most recent UA without infectious parameters.     Past Medical History:  Diagnosis Date  . Chronic leg pain    right leg nerve pain  . COPD (chronic obstructive pulmonary disease) ([place])   . Coronary artery disease last cardiologist visit 01 / 2017 approx.  in [place] (pt was living w/ her daughter)   pt hx MI 2001 w/ no intervention/  2002 cardiac cath w/ stenting --- pt currently is monitored by pcp  . Depression   . GERD (gastroesophageal reflux disease)   . History of kidney stones   . History of MI (myocardial infarction) 2001   in setting ishcemic CVA  . History of stroke with current residual effects 2001   left side ischemic stroke w/ right hemiplegia residual  . Hyperlipidemia, mixed   . Hypertension   . Insomnia   . Nephrolithiasis    bilateral non-obstructive per ct 09-21-2016  . Pulmonary nodules    RUL,  RLL,  LLL  per CT 09-20-2015 w/ [facility] ,  currently monitored by pcp  . Right hemiplegia ([place])    2001  residual ischemic  stroke   . Right renal mass    renal pelvis  . S/P coronary artery stent placement    2002  unknown details but was done at [place]  . Type 2 diabetes mellitus ([place])    per pcp note last A1c 7.1 on 07-10-2016  . Wears glasses     Past Surgical History:  Procedure Laterality Date  . CARPAL TUNNEL RELEASE  1981  . CATARACT EXTRACTION W/ INTRAOCULAR LENS  IMPLANT, BILATERAL    . CHOLECYSTECTOMY OPEN  1964  . CORONARY ANGIOPLASTY WITH STENT PLACEMENT  2002   at [facility] per family  . ORIF ANKLE FRACTURE Left 1991  . UNKNOWN APPROACH HYSTERECTOMY W/ BILATERAL SALPINGOOPHORECTOMY  1982    History reviewed. No pertinent family history. Social History:  reports that she quit smoking about 4 years ago. Her smoking use included Cigarettes. She has a 100.00 pack-year smoking history. She has never used smokeless tobacco. She reports that she does not drink alcohol or use drugs.  Allergies:  Allergies  Allergen Reactions  . Sulfa Antibiotics Hives    blisters  . Codeine Hives  . Latex Hives  . Penicillins Hives    Has patient had a PCN reaction causing immediate rash, facial/tongue/throat swelling, SOB or lightheadedness with hypotension: No Has patient had a PCN reaction causing severe rash involving mucus membranes or skin  necrosis: No Has patient had a PCN reaction that required hospitalization No Has patient had a PCN reaction occurring within the last 10 years: No If all of the above answers are "NO", then may proceed with Cephalosporin use.      No prescriptions prior to admission.    No results found for this or any previous visit (from the past 48 hour(s)). No results found.  Review of Systems  Constitutional: Negative.  Negative for chills and fever.  HENT: Negative.   Eyes: Negative.   Respiratory: Negative.   Cardiovascular: Negative.   Gastrointestinal: Negative.   Genitourinary: Positive for hematuria. Negative for dysuria.  Musculoskeletal: Negative.    Skin: Negative.   Neurological: Negative.   Endo/Heme/Allergies: Negative.   Psychiatric/Behavioral: Negative.     There were no vitals taken for this visit. Physical Exam  Constitutional: She appears well-developed.  HENT:  Head: Normocephalic.  Eyes: Pupils are equal, round, and reactive to light.  Neck: Normal range of motion.  Cardiovascular: Normal rate.   Respiratory: Effort normal.  GI: Soft.  Genitourinary:  Genitourinary Comments: NO CVAT at present  Musculoskeletal: Normal range of motion.  Neurological: She is alert.  Skin: Skin is warm.  Psychiatric: She has a normal mood and affect.     Assessment/Plan  Proceed as planned with cysto, retrogrades and RIGHT ureteroscopy / likely biopsy. Risks, benefits, alternatives, expected peri-op course discussed previously and reiterated today.   Alexis Frock, MD 10/11/2016, 7:07 AM

## 2016-10-11 NOTE — Anesthesia Postprocedure Evaluation (Signed)
Anesthesia Post Note  Patient: Tina Dyer  Procedure(s) Performed: Procedure(s) (LRB): CYSTOSCOPY WITH RETROGRADE PYELOGRAM, URETEROSCOPY WITH BIOPSY AND STENT PLACEMENT (Right)  Patient location during evaluation: PACU Anesthesia Type: General Level of consciousness: awake and alert Pain management: pain level controlled Vital Signs Assessment: post-procedure vital signs reviewed and stable Respiratory status: spontaneous breathing, nonlabored ventilation, respiratory function stable and patient connected to nasal cannula oxygen Cardiovascular status: blood pressure returned to baseline and stable Postop Assessment: no signs of nausea or vomiting Anesthetic complications: no       Last Vitals:  Vitals:   10/11/16 1130 10/11/16 1145  BP: (!) 131/57 (!) 128/45  Pulse: 85 88  Resp: 18 16  Temp:      Last Pain:  Vitals:   10/11/16 0917  TempSrc: Oral                 Michiko Lineman DAVID

## 2016-10-11 NOTE — Discharge Instructions (Signed)
1 - You may have urinary urgency (bladder spasms) and bloody urine on / off with stent in place. This is normal.  2 - Call MD or go to ER for fever >102, severe pain / nausea / vomiting not relieved by medications, or acute change in medical status  Post Anesthesia Home Care Instructions  Activity: Get plenty of rest for the remainder of the day. A responsible individual must stay with you for 24 hours following the procedure.  For the next 24 hours, DO NOT: -Drive a car -Operate machinery -Drink alcoholic beverages -Take any medication unless instructed by your physician -Make any legal decisions or sign important papers.  Meals: Start with liquid foods such as gelatin or soup. Progress to regular foods as tolerated. Avoid greasy, spicy, heavy foods. If nausea and/or vomiting occur, drink only clear liquids until the nausea and/or vomiting subsides. Call your physician if vomiting continues.  Special Instructions/Symptoms: Your throat may feel dry or sore from the anesthesia or the breathing tube placed in your throat during surgery. If this causes discomfort, gargle with warm salt water. The discomfort should disappear within 24 hours.  If you had a scopolamine patch placed behind your ear for the management of post- operative nausea and/or vomiting:  1. The medication in the patch is effective for 72 hours, after which it should be removed.  Wrap patch in a tissue and discard in the trash. Wash hands thoroughly with soap and water. 2. You may remove the patch earlier than 72 hours if you experience unpleasant side effects which may include dry mouth, dizziness or visual disturbances. 3. Avoid touching the patch. Wash your hands with soap and water after contact with the patch.     

## 2016-10-11 NOTE — Anesthesia Preprocedure Evaluation (Signed)
Anesthesia Evaluation  Patient identified by MRN, date of birth, ID band Patient awake    Reviewed: Allergy & Precautions, NPO status , Patient's Chart, lab work & pertinent test results  Airway Mallampati: I  TM Distance: >3 FB Neck ROM: Full    Dental   Pulmonary former smoker,    Pulmonary exam normal        Cardiovascular hypertension, Pt. on medications + CAD  Normal cardiovascular exam     Neuro/Psych Depression    GI/Hepatic GERD  Medicated and Controlled,  Endo/Other  diabetes, Type 2, Oral Hypoglycemic Agents  Renal/GU      Musculoskeletal   Abdominal   Peds  Hematology   Anesthesia Other Findings   Reproductive/Obstetrics                             Anesthesia Physical Anesthesia Plan  ASA: II  Anesthesia Plan: General   Post-op Pain Management:    Induction: Intravenous  Airway Management Planned: LMA  Additional Equipment:   Intra-op Plan:   Post-operative Plan: Extubation in OR  Informed Consent: I have reviewed the patients History and Physical, chart, labs and discussed the procedure including the risks, benefits and alternatives for the proposed anesthesia with the patient or authorized representative who has indicated his/her understanding and acceptance.     Plan Discussed with: CRNA and Surgeon  Anesthesia Plan Comments:         Anesthesia Quick Evaluation

## 2016-10-11 NOTE — Transfer of Care (Signed)
Immediate Anesthesia Transfer of Care Note  Patient: Tina Dyer  Procedure(s) Performed: Procedure(s) (LRB): CYSTOSCOPY WITH RETROGRADE PYELOGRAM, URETEROSCOPY WITH BIOPSY AND STENT PLACEMENT (Right)  Patient Location: PACU  Anesthesia Type: General  Level of Consciousness: awake, oriented, sedated and patient cooperative  Airway & Oxygen Therapy: Patient Spontanous Breathing and Patient connected to face mask oxygen  Post-op Assessment: Report given to PACU RN and Post -op Vital signs reviewed and stable  Post vital signs: Reviewed and stable  Complications: No apparent anesthesia complications  Last Vitals:  Vitals:   10/11/16 0917 10/11/16 1118  BP: (!) 130/48 (!) (P) 144/77  Pulse: (!) 59 (P) 95  Resp: 16 (P) 16  Temp: 36.8 C (P) 36.5 C    Last Pain:  Vitals:   10/11/16 0917  TempSrc: Oral

## 2016-10-11 NOTE — Op Note (Signed)
NAME:  Tina Dyer, Tina Dyer                     ACCOUNT NO.:  MEDICAL RECORD NO.:  [id number]  LOCATION:                                 FACILITY:  PHYSICIAN:  Alexis Frock, MD     DATE OF BIRTH:  [birth year]  DATE OF PROCEDURE: 10/11/2016                              OPERATIVE REPORT   DIAGNOSIS:  Gross hematuria, likely right renal pelvis mass.  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  PROCEDURE: 1. Cystoscopy with right retrograde pyelogram and interpretation. 2. Right ureteroscopy with biopsy. 3. Insertion of right ureteral stent, 6 x 24 Contour, no tether.  ESTIMATED BLOOD LOSS:  Nil.  COMPLICATION:  None.  SPECIMENS:  Right renal pelvis mass fragments for permanent pathology.  FINDINGS: 1. Unremarkable urinary bladder. 2. Relatively unremarkable right retrograde pyelogram. 3. Moderate volume combination of sessile, calcified, papillary lesion     in the right renal pelvis.  This seen to be emanating from a mid     pole calyx. 4. Successful placement of right ureteral stent, proximal in renal     pelvis and distal in the urinary bladder.  INDICATION:  Tina Dyer is a 76 year old lady with some significant comorbidity including prior stroke with right-sided hemiparesis, extensive smoking history, who has had a gross hematuria for some time. 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  Options were discussed for management including recommended path of operative cysto with right retrograde ureteroscopy to help rule out the possibility and she wished to proceed.  Informed consent was obtained and placed in the medical record.  PROCEDURE IN DETAIL:  The patient being, Tina Dyer, was verified. Procedure being, cysto, right ureteroscopy with possible biopsy, was confirmed.  Procedure was performed.   Time-out of carried out. Intravenous antibiotics administered.  General LMA anesthesia introduced.  The patient was placed into a low lithotomy position. Sterile field was created by prepping and draping the patient's vagina, introitus, and proximal thighs.  Cystourethroscopy performed using a rigid cystoscope with offset lens.  Inspection of urinary bladder revealed no diverticula, calcifications, papillary lesions.  The right ureteral orifice was cannulated with a 6-French end-hole catheter and right retrograde pyelogram was obtained.  Right retrograde pyelogram demonstrates a single right ureter with single-system right kidney.  There were no obvious filling defects or narrowing at this time.  A 0.038 ZIPwire was advanced into lower pole, set aside as a safety wire.  An 8-French feeding tube was placed in the urinary bladder for pressure release, and semi-rigid ureteroscopy was performed of the distal four-fifths of the right ureter alongside a separate Sensor working wire.  No mucosal abnormalities were seen whatsoever.  This was exchanged for a 12/14, 24-cm ureteral access sheath over the working wire to the level of the proximal ureter and systematic inspection was performed of the right kidney including all calyces x3 with a flexible digital ureteroscope.  As anticipated, there was a somewhat large combination papillary sessile tumor that seemed to be emanating from a right lower mid calyx.  There were calcifications on this, clearly felt that biopsy was warranted with a snaring technique. As such, an Escape basket was used to snare a large representative portion of the tumor, which was then set aside for permanent pathology. The specimen was appeared to be at least 1 cm2 total tissue.  This appeared to be adequate for pathologic analysis.  Repeat ureteroscopy revealed some expected bleeding from the area of tumor.  Given this, it is felt that additional biopsy would not be  warranted.  The access sheath was removed under continuous vision.  No mucosal abnormalities were found, and finally, a new 6 x 24 Contour-type stent was placed using fluoroscopic guidance.  Good proximal distal deployment were noted.  Bladder was emptied.  Procedure was terminated.  The patient tolerated procedure well.  No immediate periprocedural complications. The patient was taken to the postanesthesia care unit in stable condition.          ______________________________ Alexis Frock, MD     TM/MEDQ  D:  10/11/2016  T:  10/11/2016  Job:  [id number]

## 2016-10-11 NOTE — Brief Op Note (Signed)
10/11/2016  11:04 AM  PATIENT:  Tina Dyer  76 y.o. female  PRE-OPERATIVE DIAGNOSIS:  RIGHT RENAL PELVIS MASS  POST-OPERATIVE DIAGNOSIS:  * No post-op diagnosis entered *  PROCEDURE:  Procedure(s): CYSTOSCOPY WITH RETROGRADE PYELOGRAM, URETEROSCOPY WITH BIOPSY AND STENT PLACEMENT (Right)  SURGEON:  Surgeon(s) and Role:    Alexis Frock, MD - Primary  PHYSICIAN ASSISTANT:   ASSISTANTS: none   ANESTHESIA:   general  EBL:  No intake/output data recorded.  BLOOD ADMINISTERED:none  DRAINS: none   LOCAL MEDICATIONS USED:  NONE  SPECIMEN:  Source of Specimen:  Rt renal pelvis mass  DISPOSITION OF SPECIMEN:  PATHOLOGY  COUNTS:  YES  TOURNIQUET:  * No tourniquets in log *  DICTATION: .Other Dictation: Dictation Number [id number]  PLAN OF CARE: Discharge to home after PACU  PATIENT DISPOSITION:  PACU - hemodynamically stable.   Delay start of Pharmacological VTE agent (>24hrs) due to surgical blood loss or risk of bleeding: yes

## 2016-10-11 NOTE — Interval H&P Note (Signed)
History and Physical Interval Note:  10/11/2016 10:05 AM  Tina Dyer  has presented today for surgery, with the diagnosis of RIGHT RENAL PELVIS MASS  The various methods of treatment have been discussed with the patient and family. After consideration of risks, benefits and other options for treatment, the patient has consented to  Procedure(s): CYSTOSCOPY WITH RETROGRADE PYELOGRAM, URETEROSCOPY WITH BIOPSY AND STENT PLACEMENT (Right) as a surgical intervention .  The patient's history has been reviewed, patient examined, no change in status, stable for surgery.  I have reviewed the patient's chart and labs.  Questions were answered to the patient's satisfaction.     Leeland Lovelady

## 2016-10-11 NOTE — Anesthesia Procedure Notes (Signed)
Procedure Name: LMA Insertion Date/Time: 10/11/2016 10:41 AM Performed by: Denna Haggard D Pre-anesthesia Checklist: Patient identified, Emergency Drugs available, Suction available and Patient being monitored Patient Re-evaluated:Patient Re-evaluated prior to inductionOxygen Delivery Method: Circle system utilized Preoxygenation: Pre-oxygenation with 100% oxygen Intubation Type: IV induction Ventilation: Mask ventilation without difficulty LMA: LMA inserted LMA Size: 4.0 Number of attempts: 1 Airway Equipment and Method: Bite block Placement Confirmation: positive ETCO2 Tube secured with: Tape Dental Injury: Teeth and Oropharynx as per pre-operative assessment

## 2016-10-14 ENCOUNTER — Encounter (HOSPITAL_BASED_OUTPATIENT_CLINIC_OR_DEPARTMENT_OTHER): Payer: Self-pay | Admitting: Urology

## 2016-10-24 ENCOUNTER — Other Ambulatory Visit: Payer: Self-pay | Admitting: Urology

## 2016-11-11 NOTE — Patient Instructions (Signed)
Tina Dyer  11/11/2016   Your procedure is scheduled on: 11/15/2016    Report to [facility] Main  Entrance Take [place]  elevators to 3rd floor to  [place] at  0700 AM.   Call this number if you have problems the morning of surgery [phone]    Remember: ONLY 1 PERSON MAY GO WITH YOU TO SHORT STAY TO GET  READY MORNING OF YOUR SURGERY.  Do not eat food or drink liquids :After Midnight.              Clear liquid diet the day before surgery.                Magnesium citrate drink by 12 noon day before surgery.       Take these medicines the morning of surgery with A SIP OF WATEr: Ventolin Inhaler and bringm, Symbicort Inahelr and bring, Celexa, Gabapentin, Amlodipine ( NORvasc), Incruse Ellipta  DO NOT TAKE ANY DIABETIC MEDICATIONS DAY OF YOUR SURGERY                               You may not have any metal on your body including hair pins and              piercings  Do not wear jewelry, make-up, lotions, powders or perfumes, deodorant             Do not wear nail polish.  Do not shave  48 hours prior to surgery.     Do not bring valuables to the hospital. [place].  Contacts, dentures or bridgework may not be worn into surgery.  Leave suitcase in the car. After surgery it may be brought to your room.                     Please read over the following fact sheets you were given: _____________________________________________________________________                CLEAR LIQUID DIET   Foods Allowed                                                                     Foods Excluded  Coffee and tea, regular and decaf                             liquids that you cannot  Plain Jell-O in any flavor                                             see through such as: Fruit ices (not with fruit pulp)                                     milk,  soups, orange juice  Iced Popsicles                                     All solid food Carbonated beverages, regular and diet                                    Cranberry, grape and apple juices Sports drinks like Gatorade Lightly seasoned clear broth or consume(fat free) Sugar, honey syrup  Sample Menu Breakfast                                Lunch                                     Supper Cranberry juice                    Beef broth                            Chicken broth Jell-O                                     Grape juice                           Apple juice Coffee or tea                        Jell-O                                      Popsicle                                                Coffee or tea                        Coffee or tea  _____________________________________________________________________  [facility] - Preparing for Surgery Before surgery, you can play an important role.  Because skin is not sterile, your skin needs to be as free of germs as possible.  You can reduce the number of germs on your skin by washing with CHG (chlorahexidine gluconate) soap before surgery.  CHG is an antiseptic cleaner which kills germs and bonds with the skin to continue killing germs even after washing. Please DO NOT use if you have an allergy to CHG or antibacterial soaps.  If your skin becomes reddened/irritated stop using the CHG and inform your nurse when you arrive at Short Stay. Do not shave (including legs and underarms) for at least 48 hours prior to the first CHG shower.  You may shave your face/neck. Please follow these instructions carefully:  1.  Shower with CHG Soap the night before surgery and the  morning of Surgery.  2.  If you choose to wash  your hair, wash your hair first as usual with your  normal  shampoo.  3.  After you shampoo, rinse your hair and body thoroughly to remove the  shampoo.                           4.  Use CHG as you would any other liquid soap.  You can apply chg directly  to the skin and wash                        Gently with a scrungie or clean washcloth.  5.  Apply the CHG Soap to your body ONLY FROM THE NECK DOWN.   Do not use on face/ open                           Wound or open sores. Avoid contact with eyes, ears mouth and genitals (private parts).                       Wash face,  Genitals (private parts) with your normal soap.             6.  Wash thoroughly, paying special attention to the area where your surgery  will be performed.  7.  Thoroughly rinse your body with warm water from the neck down.  8.  DO NOT shower/wash with your normal soap after using and rinsing off  the CHG Soap.                9.  Pat yourself dry with a clean towel.            10.  Wear clean pajamas.            11.  Place clean sheets on your bed the night of your first shower and do not  sleep with pets. Day of Surgery : Do not apply any lotions/deodorants the morning of surgery.  Please wear clean clothes to the hospital/surgery center.  FAILURE TO FOLLOW THESE INSTRUCTIONS MAY RESULT IN THE CANCELLATION OF YOUR SURGERY PATIENT SIGNATURE_________________________________  NURSE SIGNATURE__________________________________  ________________________________________________________________________  WHAT IS A BLOOD TRANSFUSION? Blood Transfusion Information  A transfusion is the replacement of blood or some of its parts. Blood is made up of multiple cells which provide different functions.  Red blood cells carry oxygen and are used for blood loss replacement.  White blood cells fight against infection.  Platelets control bleeding.  Plasma helps clot blood.  Other blood products are available for specialized needs, such as hemophilia or other clotting disorders. BEFORE THE TRANSFUSION  Who gives blood for transfusions?   Healthy volunteers who are fully evaluated to make sure their blood is safe. This is blood bank blood. Transfusion therapy is the safest it has ever been in the practice of  medicine. Before blood is taken from a donor, a complete history is taken to make sure that person has no history of diseases nor engages in risky social behavior (examples are intravenous drug use or sexual activity with multiple partners). The donor's travel history is screened to minimize risk of transmitting infections, such as malaria. The donated blood is tested for signs of infectious diseases, such as HIV and hepatitis. The blood is then tested to be sure it is compatible with you in order to minimize the chance of a transfusion reaction. If you or a  relative donates blood, this is often done in anticipation of surgery and is not appropriate for emergency situations. It takes many days to process the donated blood. RISKS AND COMPLICATIONS Although transfusion therapy is very safe and saves many lives, the main dangers of transfusion include:   Getting an infectious disease.  Developing a transfusion reaction. This is an allergic reaction to something in the blood you were given. Every precaution is taken to prevent this. The decision to have a blood transfusion has been considered carefully by your caregiver before blood is given. Blood is not given unless the benefits outweigh the risks. AFTER THE TRANSFUSION  Right after receiving a blood transfusion, you will usually feel much better and more energetic. This is especially true if your red blood cells have gotten low (anemic). The transfusion raises the level of the red blood cells which carry oxygen, and this usually causes an energy increase.  The nurse administering the transfusion will monitor you carefully for complications. HOME CARE INSTRUCTIONS  No special instructions are needed after a transfusion. You may find your energy is better. Speak with your caregiver about any limitations on activity for underlying diseases you may have. SEEK MEDICAL CARE IF:   Your condition is not improving after your transfusion.  You develop  redness or irritation at the intravenous (IV) site. SEEK IMMEDIATE MEDICAL CARE IF:  Any of the following symptoms occur over the next 12 hours:  Shaking chills.  You have a temperature by mouth above 102 F (38.9 C), not controlled by medicine.  Chest, back, or muscle pain.  People around you feel you are not acting correctly or are confused.  Shortness of breath or difficulty breathing.  Dizziness and fainting.  You get a rash or develop hives.  You have a decrease in urine output.  Your urine turns a dark color or changes to pink, red, or brown. Any of the following symptoms occur over the next 10 days:  You have a temperature by mouth above 102 F (38.9 C), not controlled by medicine.  Shortness of breath.  Weakness after normal activity.  The white part of the eye turns yellow (jaundice).  You have a decrease in the amount of urine or are urinating less often.  Your urine turns a dark color or changes to pink, red, or brown. Document Released: 05/10/2000 Document Revised: 08/05/2011 Document Reviewed: 12/28/2007 [facility] Patient Information 2014 [place], [place].  _______________________________________________________________________

## 2016-11-12 ENCOUNTER — Encounter (HOSPITAL_COMMUNITY): Payer: Self-pay

## 2016-11-12 ENCOUNTER — Encounter (HOSPITAL_COMMUNITY)
Admission: RE | Admit: 2016-11-12 | Discharge: 2016-11-12 | Disposition: A | Discharge status: Home / Self Care | Payer: Medicare Other | Source: Ambulatory Visit | Attending: Urology | Admitting: Urology

## 2016-11-12 DIAGNOSIS — Z01812 Encounter for preprocedural laboratory examination: Secondary | ICD-10-CM | POA: Insufficient documentation

## 2016-11-12 HISTORY — DX: Family history of other specified conditions: Z84.89

## 2016-11-12 HISTORY — DX: Acute myocardial infarction, unspecified: I21.9

## 2016-11-12 HISTORY — DX: Unspecified osteoarthritis, unspecified site: M19.90

## 2016-11-12 LAB — CBC
HCT: 35.1 % — ABNORMAL LOW (ref 36.0–46.0)
Hemoglobin: 11.4 g/dL — ABNORMAL LOW (ref 12.0–15.0)
MCH: 28.6 pg (ref 26.0–34.0)
MCHC: 32.5 g/dL (ref 30.0–36.0)
MCV: 88.2 fL (ref 78.0–100.0)
Platelets: 210 10*3/uL (ref 150–400)
RBC: 3.98 MIL/uL (ref 3.87–5.11)
RDW: 13.2 % (ref 11.5–15.5)
WBC: 4.3 10*3/uL (ref 4.0–10.5)

## 2016-11-12 LAB — BASIC METABOLIC PANEL
ANION GAP: 7 (ref 5–15)
BUN: 21 mg/dL — AB (ref 6–20)
CALCIUM: 9.4 mg/dL (ref 8.9–10.3)
CO2: 29 mmol/L (ref 22–32)
Chloride: 105 mmol/L (ref 101–111)
Creatinine, Ser: 0.87 mg/dL (ref 0.44–1.00)
GFR calc Af Amer: >60 mL/min (ref >60)
GLUCOSE: 108 mg/dL — AB (ref 65–99)
Potassium: 5 mmol/L (ref 3.5–5.1)
Sodium: 141 mmol/L (ref 135–145)

## 2016-11-12 LAB — GLUCOSE, CAPILLARY: Glucose-Capillary: 95 mg/dL (ref 65–99)

## 2016-11-12 LAB — ABO/RH: ABO/RH(D): O POS

## 2016-11-12 NOTE — Progress Notes (Signed)
EKG-10/11/16-epic

## 2016-11-12 NOTE — Progress Notes (Signed)
LOV note- 07/10/16- in epic with PCP- Dr Vicenta Aly

## 2016-11-13 LAB — HEMOGLOBIN A1C
Hgb A1c MFr Bld: 7.1 % — ABNORMAL HIGH (ref 4.8–5.6)
MEAN PLASMA GLUCOSE: 157 mg/dL

## 2016-11-13 NOTE — Progress Notes (Signed)
Requested any clearances from [facility] ) LVMM be faxed to [place].

## 2016-11-14 NOTE — Progress Notes (Signed)
No clearance requested  note per Dr Tresa Moore placed on front of chart.

## 2016-11-15 ENCOUNTER — Encounter (HOSPITAL_COMMUNITY): Payer: Self-pay | Admitting: *Deleted

## 2016-11-15 ENCOUNTER — Inpatient Hospital Stay (HOSPITAL_COMMUNITY): Payer: Medicare Other | Admitting: Certified Registered Nurse Anesthetist

## 2016-11-15 ENCOUNTER — Inpatient Hospital Stay (HOSPITAL_COMMUNITY)
Admission: RE | Admit: 2016-11-15 | Discharge: 2016-11-18 | DRG: 657 | Disposition: A | Discharge status: Skilled Nursing Facility | Payer: Medicare Other | Source: Ambulatory Visit | Attending: Urology | Admitting: Urology

## 2016-11-15 ENCOUNTER — Encounter (HOSPITAL_COMMUNITY)
Admission: RE | Disposition: A | Discharge status: Skilled Nursing Facility | Payer: Self-pay | Source: Ambulatory Visit | Attending: Urology

## 2016-11-15 DIAGNOSIS — K567 Ileus, unspecified: Secondary | ICD-10-CM | POA: Diagnosis not present

## 2016-11-15 DIAGNOSIS — I69351 Hemiplegia and hemiparesis following cerebral infarction affecting right dominant side: Secondary | ICD-10-CM | POA: Diagnosis not present

## 2016-11-15 DIAGNOSIS — I252 Old myocardial infarction: Secondary | ICD-10-CM

## 2016-11-15 DIAGNOSIS — J449 Chronic obstructive pulmonary disease, unspecified: Secondary | ICD-10-CM | POA: Diagnosis present

## 2016-11-15 DIAGNOSIS — Z955 Presence of coronary angioplasty implant and graft: Secondary | ICD-10-CM

## 2016-11-15 DIAGNOSIS — N200 Calculus of kidney: Secondary | ICD-10-CM | POA: Diagnosis present

## 2016-11-15 DIAGNOSIS — K660 Peritoneal adhesions (postprocedural) (postinfection): Secondary | ICD-10-CM | POA: Diagnosis present

## 2016-11-15 DIAGNOSIS — I10 Essential (primary) hypertension: Secondary | ICD-10-CM | POA: Diagnosis present

## 2016-11-15 DIAGNOSIS — Z882 Allergy status to sulfonamides status: Secondary | ICD-10-CM | POA: Diagnosis not present

## 2016-11-15 DIAGNOSIS — E782 Mixed hyperlipidemia: Secondary | ICD-10-CM | POA: Diagnosis present

## 2016-11-15 DIAGNOSIS — N2889 Other specified disorders of kidney and ureter: Secondary | ICD-10-CM | POA: Diagnosis present

## 2016-11-15 DIAGNOSIS — Z885 Allergy status to narcotic agent status: Secondary | ICD-10-CM | POA: Diagnosis not present

## 2016-11-15 DIAGNOSIS — R310 Gross hematuria: Secondary | ICD-10-CM | POA: Diagnosis present

## 2016-11-15 DIAGNOSIS — Z87891 Personal history of nicotine dependence: Secondary | ICD-10-CM

## 2016-11-15 DIAGNOSIS — C651 Malignant neoplasm of right renal pelvis: Principal | ICD-10-CM | POA: Diagnosis present

## 2016-11-15 DIAGNOSIS — Z880 Allergy status to penicillin: Secondary | ICD-10-CM

## 2016-11-15 DIAGNOSIS — E119 Type 2 diabetes mellitus without complications: Secondary | ICD-10-CM | POA: Diagnosis present

## 2016-11-15 DIAGNOSIS — I2510 Atherosclerotic heart disease of native coronary artery without angina pectoris: Secondary | ICD-10-CM | POA: Diagnosis present

## 2016-11-15 DIAGNOSIS — Z91040 Latex allergy status: Secondary | ICD-10-CM

## 2016-11-15 HISTORY — PX: ROBOT ASSITED LAPAROSCOPIC NEPHROURETERECTOMY: SHX6077

## 2016-11-15 LAB — TYPE AND SCREEN
ABO/RH(D): O POS
ANTIBODY SCREEN: NEGATIVE

## 2016-11-15 LAB — GLUCOSE, CAPILLARY
GLUCOSE-CAPILLARY: 164 mg/dL — AB (ref 65–99)
Glucose-Capillary: 97 mg/dL (ref 65–99)
Glucose-Capillary: 173 mg/dL — ABNORMAL HIGH (ref 65–99)
Glucose-Capillary: 140 mg/dL — ABNORMAL HIGH (ref 65–99)

## 2016-11-15 LAB — HEMOGLOBIN AND HEMATOCRIT, BLOOD
HEMATOCRIT: 33.5 % — AB (ref 36.0–46.0)
Hemoglobin: 11.3 g/dL — ABNORMAL LOW (ref 12.0–15.0)

## 2016-11-15 SURGERY — NEPHROURETERECTOMY, ROBOT-ASSISTED, LAPAROSCOPIC
Anesthesia: General | Laterality: Right

## 2016-11-15 MED ORDER — ONDANSETRON HCL 4 MG/2ML IJ SOLN
INTRAMUSCULAR | Status: AC
  Filled 2016-11-15: qty 2

## 2016-11-15 MED ORDER — PROPOFOL 10 MG/ML IV BOLUS
INTRAVENOUS | Status: DC | PRN
  Administered 2016-11-15: 130 mg via INTRAVENOUS

## 2016-11-15 MED ORDER — MOMETASONE FURO-FORMOTEROL FUM 200-5 MCG/ACT IN AERO
2.0000 | INHALATION_SPRAY | Freq: Two times a day (BID) | RESPIRATORY_TRACT | Status: DC
  Administered 2016-11-15 – 2016-11-18 (×5): 2 via RESPIRATORY_TRACT
  Filled 2016-11-15: qty 8.8

## 2016-11-15 MED ORDER — LACTATED RINGERS IV SOLN
INTRAVENOUS | Status: DC
  Administered 2016-11-15 (×2): via INTRAVENOUS

## 2016-11-15 MED ORDER — CIPROFLOXACIN IN D5W 400 MG/200ML IV SOLN
400.0000 mg | INTRAVENOUS | Status: AC
  Administered 2016-11-15: 400 mg via INTRAVENOUS
  Filled 2016-11-15: qty 200

## 2016-11-15 MED ORDER — GABAPENTIN 300 MG PO CAPS
300.0000 mg | ORAL_CAPSULE | Freq: Four times a day (QID) | ORAL | Status: DC
  Administered 2016-11-15 – 2016-11-18 (×10): 300 mg via ORAL
  Filled 2016-11-15 (×11): qty 1

## 2016-11-15 MED ORDER — PANTOPRAZOLE SODIUM 40 MG PO TBEC
40.0000 mg | DELAYED_RELEASE_TABLET | Freq: Every day | ORAL | Status: DC
  Administered 2016-11-15 – 2016-11-18 (×4): 40 mg via ORAL
  Filled 2016-11-15 (×4): qty 1

## 2016-11-15 MED ORDER — ALBUTEROL SULFATE (2.5 MG/3ML) 0.083% IN NEBU: 2.5000 mg | INHALATION_SOLUTION | RESPIRATORY_TRACT | Status: DC | PRN

## 2016-11-15 MED ORDER — BUPIVACAINE LIPOSOME 1.3 % IJ SUSP
20.0000 mL | Freq: Once | INTRAMUSCULAR | Status: AC
  Administered 2016-11-15: 20 mL
  Filled 2016-11-15: qty 20

## 2016-11-15 MED ORDER — CLINDAMYCIN PHOSPHATE 900 MG/50ML IV SOLN
900.0000 mg | INTRAVENOUS | Status: AC
  Administered 2016-11-15: 900 mg via INTRAVENOUS
  Filled 2016-11-15: qty 50

## 2016-11-15 MED ORDER — ACETAMINOPHEN 500 MG PO TABS
1000.0000 mg | ORAL_TABLET | Freq: Four times a day (QID) | ORAL | Status: AC
  Administered 2016-11-15 – 2016-11-16 (×4): 1000 mg via ORAL
  Filled 2016-11-15 (×4): qty 2

## 2016-11-15 MED ORDER — HYDROMORPHONE HCL 1 MG/ML IJ SOLN
0.5000 mg | INTRAMUSCULAR | Status: DC | PRN
  Filled 2016-11-15: qty 1

## 2016-11-15 MED ORDER — PROMETHAZINE HCL 25 MG/ML IJ SOLN: 6.2500 mg | INTRAMUSCULAR | Status: DC | PRN

## 2016-11-15 MED ORDER — STERILE WATER FOR IRRIGATION IR SOLN
Status: DC | PRN
Start: 2016-11-15 — End: 2016-11-15
  Administered 2016-11-15: 1000 mL

## 2016-11-15 MED ORDER — ROCURONIUM BROMIDE 50 MG/5ML IV SOSY
PREFILLED_SYRINGE | INTRAVENOUS | Status: AC
  Filled 2016-11-15: qty 5

## 2016-11-15 MED ORDER — LIDOCAINE 2% (20 MG/ML) 5 ML SYRINGE
INTRAMUSCULAR | Status: AC
  Filled 2016-11-15: qty 5

## 2016-11-15 MED ORDER — INSULIN ASPART 100 UNIT/ML ~~LOC~~ SOLN
0.0000 [IU] | Freq: Three times a day (TID) | SUBCUTANEOUS | Status: DC
  Administered 2016-11-15: 3 [IU] via SUBCUTANEOUS
  Administered 2016-11-16 – 2016-11-17 (×2): 2 [IU] via SUBCUTANEOUS

## 2016-11-15 MED ORDER — ROCURONIUM BROMIDE 100 MG/10ML IV SOLN
INTRAVENOUS | Status: DC | PRN
  Administered 2016-11-15: 10 mg via INTRAVENOUS
  Administered 2016-11-15: 50 mg via INTRAVENOUS

## 2016-11-15 MED ORDER — DIPHENHYDRAMINE HCL 12.5 MG/5ML PO ELIX: 12.5000 mg | ORAL_SOLUTION | Freq: Four times a day (QID) | ORAL | Status: DC | PRN

## 2016-11-15 MED ORDER — HYDROMORPHONE HCL 1 MG/ML IJ SOLN
0.5000 mg | INTRAMUSCULAR | Status: DC | PRN
  Administered 2016-11-15 (×2): 1 mg via INTRAVENOUS
  Administered 2016-11-16: 0.5 mg via INTRAVENOUS
  Administered 2016-11-16: 1 mg via INTRAVENOUS
  Administered 2016-11-16 (×2): 0.5 mg via INTRAVENOUS
  Filled 2016-11-15 (×8): qty 1

## 2016-11-15 MED ORDER — TRAMADOL HCL 50 MG PO TABS: 50.0000 mg | ORAL_TABLET | Freq: Four times a day (QID) | ORAL | 0 refills | Status: AC | PRN

## 2016-11-15 MED ORDER — ROPINIROLE HCL 1 MG PO TABS: 0.5000 mg | ORAL_TABLET | Freq: Three times a day (TID) | ORAL | Status: DC

## 2016-11-15 MED ORDER — LACTATED RINGERS IV SOLN
INTRAVENOUS | Status: DC | PRN
  Administered 2016-11-15 (×2): via INTRAVENOUS

## 2016-11-15 MED ORDER — HYDROMORPHONE HCL 1 MG/ML IJ SOLN
0.2500 mg | INTRAMUSCULAR | Status: DC | PRN
  Administered 2016-11-15: 0.5 mg via INTRAVENOUS
  Administered 2016-11-15 (×2): 0.25 mg via INTRAVENOUS
  Administered 2016-11-15: 0.5 mg via INTRAVENOUS

## 2016-11-15 MED ORDER — HYDROMORPHONE HCL 1 MG/ML IJ SOLN: 0.2500 mg | INTRAMUSCULAR | Status: DC | PRN

## 2016-11-15 MED ORDER — LIDOCAINE HCL (CARDIAC) 20 MG/ML IV SOLN
INTRAVENOUS | Status: DC | PRN
  Administered 2016-11-15: 60 mg via INTRAVENOUS

## 2016-11-15 MED ORDER — ROPINIROLE HCL 0.5 MG PO TABS
0.7500 mg | ORAL_TABLET | ORAL | Status: DC
  Administered 2016-11-15 – 2016-11-17 (×3): 0.75 mg via ORAL
  Filled 2016-11-15 (×3): qty 1

## 2016-11-15 MED ORDER — FENTANYL CITRATE (PF) 250 MCG/5ML IJ SOLN
INTRAMUSCULAR | Status: AC
  Filled 2016-11-15: qty 5

## 2016-11-15 MED ORDER — MIDAZOLAM HCL 5 MG/5ML IJ SOLN
INTRAMUSCULAR | Status: DC | PRN
  Administered 2016-11-15: 2 mg via INTRAVENOUS

## 2016-11-15 MED ORDER — MIDAZOLAM HCL 2 MG/2ML IJ SOLN
INTRAMUSCULAR | Status: AC
  Filled 2016-11-15: qty 2

## 2016-11-15 MED ORDER — SUGAMMADEX SODIUM 200 MG/2ML IV SOLN
INTRAVENOUS | Status: DC | PRN
  Administered 2016-11-15: 200 mg via INTRAVENOUS

## 2016-11-15 MED ORDER — LACTATED RINGERS IR SOLN
Status: DC | PRN
  Administered 2016-11-15: 1000 mL

## 2016-11-15 MED ORDER — ALBUTEROL SULFATE HFA 108 (90 BASE) MCG/ACT IN AERS: 2.0000 | INHALATION_SPRAY | RESPIRATORY_TRACT | Status: DC | PRN

## 2016-11-15 MED ORDER — CITALOPRAM HYDROBROMIDE 20 MG PO TABS
10.0000 mg | ORAL_TABLET | Freq: Every day | ORAL | Status: DC
  Administered 2016-11-16 – 2016-11-18 (×3): 10 mg via ORAL
  Filled 2016-11-15 (×3): qty 1

## 2016-11-15 MED ORDER — TRAMADOL HCL 50 MG PO TABS
50.0000 mg | ORAL_TABLET | Freq: Four times a day (QID) | ORAL | Status: DC | PRN
  Administered 2016-11-15 – 2016-11-17 (×2): 100 mg via ORAL
  Administered 2016-11-17: 50 mg via ORAL
  Filled 2016-11-15: qty 1
  Filled 2016-11-15 (×2): qty 2

## 2016-11-15 MED ORDER — PROPOFOL 10 MG/ML IV BOLUS
INTRAVENOUS | Status: AC
  Filled 2016-11-15: qty 20

## 2016-11-15 MED ORDER — SODIUM CHLORIDE 0.45 % IV SOLN
INTRAVENOUS | Status: DC
  Administered 2016-11-15 – 2016-11-18 (×8): via INTRAVENOUS

## 2016-11-15 MED ORDER — HYDROMORPHONE HCL 1 MG/ML IJ SOLN
INTRAMUSCULAR | Status: AC
  Filled 2016-11-15: qty 0.5

## 2016-11-15 MED ORDER — ATORVASTATIN CALCIUM 40 MG PO TABS
80.0000 mg | ORAL_TABLET | Freq: Every day | ORAL | Status: DC
  Administered 2016-11-15 – 2016-11-17 (×3): 80 mg via ORAL
  Filled 2016-11-15 (×3): qty 2

## 2016-11-15 MED ORDER — OXYCODONE HCL 5 MG PO TABS: 5.0000 mg | ORAL_TABLET | Freq: Once | ORAL | Status: DC | PRN

## 2016-11-15 MED ORDER — SODIUM CHLORIDE 0.9 % IJ SOLN
INTRAMUSCULAR | Status: AC
  Filled 2016-11-15: qty 20

## 2016-11-15 MED ORDER — AMLODIPINE BESYLATE 5 MG PO TABS
5.0000 mg | ORAL_TABLET | Freq: Every day | ORAL | Status: DC
  Administered 2016-11-15 – 2016-11-18 (×4): 5 mg via ORAL
  Filled 2016-11-15 (×4): qty 1

## 2016-11-15 MED ORDER — OXYCODONE HCL 5 MG/5ML PO SOLN
5.0000 mg | Freq: Once | ORAL | Status: DC | PRN
Start: 2016-11-15 — End: 2016-11-15
  Filled 2016-11-15: qty 5

## 2016-11-15 MED ORDER — UMECLIDINIUM BROMIDE 62.5 MCG/INH IN AEPB
1.0000 | INHALATION_SPRAY | Freq: Every day | RESPIRATORY_TRACT | Status: DC
  Administered 2016-11-16 – 2016-11-18 (×3): 1 via RESPIRATORY_TRACT
  Filled 2016-11-15: qty 7

## 2016-11-15 MED ORDER — ROPINIROLE HCL 1 MG PO TABS
0.5000 mg | ORAL_TABLET | ORAL | Status: DC
  Administered 2016-11-15 – 2016-11-17 (×3): 0.5 mg via ORAL
  Filled 2016-11-15 (×2): qty 0.5

## 2016-11-15 MED ORDER — SODIUM CHLORIDE 0.9 % IJ SOLN
INTRAMUSCULAR | Status: DC | PRN
  Administered 2016-11-15: 20 mL

## 2016-11-15 MED ORDER — SUCCINYLCHOLINE CHLORIDE 20 MG/ML IJ SOLN
INTRAMUSCULAR | Status: DC | PRN
  Administered 2016-11-15: 140 mg via INTRAVENOUS

## 2016-11-15 MED ORDER — SUGAMMADEX SODIUM 200 MG/2ML IV SOLN
INTRAVENOUS | Status: AC
  Filled 2016-11-15: qty 2

## 2016-11-15 MED ORDER — SUCCINYLCHOLINE CHLORIDE 200 MG/10ML IV SOSY
PREFILLED_SYRINGE | INTRAVENOUS | Status: AC
  Filled 2016-11-15: qty 10

## 2016-11-15 MED ORDER — ONDANSETRON HCL 4 MG/2ML IJ SOLN
INTRAMUSCULAR | Status: DC | PRN
Start: 2016-11-15 — End: 2016-11-15
  Administered 2016-11-15: 4 mg via INTRAVENOUS

## 2016-11-15 MED ORDER — EPHEDRINE SULFATE 50 MG/ML IJ SOLN
INTRAMUSCULAR | Status: DC | PRN
  Administered 2016-11-15: 10 mg via INTRAVENOUS
  Administered 2016-11-15: 20 mg via INTRAVENOUS

## 2016-11-15 MED ORDER — FENTANYL CITRATE (PF) 100 MCG/2ML IJ SOLN
INTRAMUSCULAR | Status: DC | PRN
  Administered 2016-11-15 (×2): 50 ug via INTRAVENOUS
  Administered 2016-11-15 (×3): 100 ug via INTRAVENOUS

## 2016-11-15 MED ORDER — ONDANSETRON HCL 4 MG/2ML IJ SOLN
4.0000 mg | INTRAMUSCULAR | Status: DC | PRN
  Administered 2016-11-15 – 2016-11-17 (×5): 4 mg via INTRAVENOUS
  Filled 2016-11-15 (×5): qty 2

## 2016-11-15 MED ORDER — DIPHENHYDRAMINE HCL 50 MG/ML IJ SOLN: 12.5000 mg | Freq: Four times a day (QID) | INTRAMUSCULAR | Status: DC | PRN

## 2016-11-15 SURGICAL SUPPLY — 67 items
ADH SKN CLS APL DERMABOND .7 (GAUZE/BANDAGES/DRESSINGS) ×2
BAG LAPAROSCOPIC 12 15 PORT 16 (BASKET) ×1 IMPLANT
BAG RETRIEVAL 12/15 (BASKET) ×2
BAG RETRIEVAL 12/15MM (BASKET) ×1
CHLORAPREP W/TINT 26ML (MISCELLANEOUS) ×3 IMPLANT
CLIP LIGATING HEM O LOK PURPLE (MISCELLANEOUS) ×3 IMPLANT
CLIP LIGATING HEMO LOK XL GOLD (MISCELLANEOUS) ×3 IMPLANT
CLIP LIGATING HEMO O LOK GREEN (MISCELLANEOUS) ×6 IMPLANT
COVER SURGICAL LIGHT HANDLE (MISCELLANEOUS) ×3 IMPLANT
COVER TIP SHEARS 8 DVNC (MISCELLANEOUS) ×1 IMPLANT
COVER TIP SHEARS 8MM DA VINCI (MISCELLANEOUS) ×2
DECANTER SPIKE VIAL GLASS SM (MISCELLANEOUS) ×3 IMPLANT
DERMABOND ADVANCED (GAUZE/BANDAGES/DRESSINGS) ×4
DERMABOND ADVANCED .7 DNX12 (GAUZE/BANDAGES/DRESSINGS) ×2 IMPLANT
DRAIN CHANNEL 15F RND FF 3/16 (WOUND CARE) ×3 IMPLANT
DRAPE ARM DVNC X/XI (DISPOSABLE) ×4 IMPLANT
DRAPE COLUMN DVNC XI (DISPOSABLE) ×1 IMPLANT
DRAPE DA VINCI XI ARM (DISPOSABLE) ×8
DRAPE DA VINCI XI COLUMN (DISPOSABLE) ×2
DRAPE INCISE IOBAN 66X45 STRL (DRAPES) ×3 IMPLANT
DRAPE SHEET LG 3/4 BI-LAMINATE (DRAPES) ×3 IMPLANT
ELECT PENCIL ROCKER SW 15FT (MISCELLANEOUS) ×3 IMPLANT
ELECT REM PT RETURN 15FT ADLT (MISCELLANEOUS) ×3 IMPLANT
EVACUATOR SILICONE 100CC (DRAIN) ×3 IMPLANT
GLOVE BIO SURGEON STRL SZ 6.5 (GLOVE) IMPLANT
GLOVE BIO SURGEONS STRL SZ 6.5 (GLOVE)
GLOVE BIOGEL M STRL SZ7.5 (GLOVE) IMPLANT
GLOVE BIOGEL PI IND STRL 6.5 (GLOVE) ×2 IMPLANT
GLOVE BIOGEL PI IND STRL 7.0 (GLOVE) ×3 IMPLANT
GLOVE BIOGEL PI INDICATOR 6.5 (GLOVE) ×4
GLOVE BIOGEL PI INDICATOR 7.0 (GLOVE) ×6
GLOVE SURG SS PI 7.5 STRL IVOR (GLOVE) ×6 IMPLANT
GOWN STRL REUS W/TWL LRG LVL3 (GOWN DISPOSABLE) ×12 IMPLANT
IRRIG SUCT STRYKERFLOW 2 WTIP (MISCELLANEOUS) ×3
IRRIGATION SUCT STRKRFLW 2 WTP (MISCELLANEOUS) ×1 IMPLANT
KIT BASIN OR (CUSTOM PROCEDURE TRAY) ×3 IMPLANT
LOOP VESSEL MAXI BLUE (MISCELLANEOUS) ×3 IMPLANT
MARKER SKIN DUAL TIP RULER LAB (MISCELLANEOUS) ×3 IMPLANT
NEEDLE INSUFFLATION 14GA 120MM (NEEDLE) ×3 IMPLANT
NS IRRIG 1000ML POUR BTL (IV SOLUTION) IMPLANT
PORT ACCESS TROCAR AIRSEAL 12 (TROCAR) ×1 IMPLANT
PORT ACCESS TROCAR AIRSEAL 5M (TROCAR) ×2
POSITIONER SURGICAL ARM (MISCELLANEOUS) ×9 IMPLANT
RELOAD STAPLER WHITE 60MM (STAPLE) ×4 IMPLANT
SEAL CANN UNIV 5-8 DVNC XI (MISCELLANEOUS) ×5 IMPLANT
SEAL XI 5MM-8MM UNIVERSAL (MISCELLANEOUS) ×10
SET TRI-LUMEN FLTR TB AIRSEAL (TUBING) ×3 IMPLANT
SOLUTION ELECTROLUBE (MISCELLANEOUS) ×3 IMPLANT
STAPLE ECHEON FLEX 60 POW ENDO (STAPLE) ×2 IMPLANT
STAPLER RELOAD WHITE 60MM (STAPLE) ×12
SUT ETHILON 3 0 PS 1 (SUTURE) ×3 IMPLANT
SUT MNCRL AB 4-0 PS2 18 (SUTURE) ×6 IMPLANT
SUT PDS AB 1 CT1 27 (SUTURE) ×9 IMPLANT
SUT VIC AB 2-0 SH 27 (SUTURE) ×3
SUT VIC AB 2-0 SH 27X BRD (SUTURE) ×1 IMPLANT
SUT VLOC BARB 180 ABS3/0GR12 (SUTURE)
SUTURE VLOC BRB 180 ABS3/0GR12 (SUTURE) IMPLANT
TAPE STRIPS DRAPE STRL (GAUZE/BANDAGES/DRESSINGS) ×1 IMPLANT
TOWEL OR 17X26 10 PK STRL BLUE (TOWEL DISPOSABLE) ×3 IMPLANT
TOWEL OR NON WOVEN STRL DISP B (DISPOSABLE) ×3 IMPLANT
TRAY FOLEY METER SIL LF 16FR (CATHETERS) ×3 IMPLANT
TRAY FOLEY W/METER SILVER 16FR (SET/KITS/TRAYS/PACK) IMPLANT
TRAY LAPAROSCOPIC (CUSTOM PROCEDURE TRAY) ×3 IMPLANT
TROCAR BLADELESS OPT 12M 100M (ENDOMECHANICALS) ×3 IMPLANT
TROCAR BLADELESS OPT 5 100 (ENDOMECHANICALS) ×3 IMPLANT
TROCAR XCEL 12X100 BLDLESS (ENDOMECHANICALS) ×3 IMPLANT
WATER STERILE IRR 1000ML POUR (IV SOLUTION) IMPLANT

## 2016-11-15 NOTE — Discharge Instructions (Signed)
1.  Activity:  You are encouraged to ambulate frequently (about every hour during waking hours) to help prevent blood clots from forming in your legs or lungs.  However, you should not engage in any heavy lifting (> 10-15 lbs), strenuous activity, or straining. °2. Diet: You should advance your diet as instructed by your physician.  It will be normal to have some bloating, nausea, and abdominal discomfort intermittently. °3. Prescriptions:  You will be provided a prescription for pain medication to take as needed.  If your pain is not severe enough to require the prescription pain medication, you may take extra strength Tylenol instead which will have less side effects.  You should also take a prescribed stool softener to avoid straining with bowel movements as the prescription pain medication may constipate you. °4. Incisions: You may remove your dressing bandages 48 hours after surgery if not removed in the hospital.  You will either have some small staples or special tissue glue at each of the incision sites. Once the bandages are removed (if present), the incisions may stay open to air.  You may start showering (but not soaking or bathing in water) the 2nd day after surgery and the incisions simply need to be patted dry after the shower.  No additional care is needed. °5. What to call us about: You should call the office ([phone]) if you develop fever > 101 or develop persistent vomiting. ° ° ° °You may resume aspirin, advil, vitamins, and supplements 7 days after surgery. °

## 2016-11-15 NOTE — Anesthesia Postprocedure Evaluation (Signed)
Anesthesia Post Note  Patient: Tina Dyer  Procedure(s) Performed: Procedure(s) (LRB): XI ROBOT ASSITED LAPAROSCOPIC NEPHROURETERECTOMY (Right)     Patient location during evaluation: PACU Anesthesia Type: General Level of consciousness: awake and alert Pain management: pain level controlled Vital Signs Assessment: post-procedure vital signs reviewed and stable Respiratory status: spontaneous breathing, nonlabored ventilation and respiratory function stable Cardiovascular status: blood pressure returned to baseline and stable Postop Assessment: no signs of nausea or vomiting Anesthetic complications: no    Last Vitals:  Vitals:   11/15/16 1245 11/15/16 1305  BP: (!) 152/70 (!) 160/62  Pulse: 84 71  Resp: 14 15  Temp: 36.4 C 36.7 C    Last Pain:  Vitals:   11/15/16 1325  TempSrc:   PainSc: [name]

## 2016-11-15 NOTE — Transfer of Care (Signed)
Immediate Anesthesia Transfer of Care Note  Patient: Tina Dyer  Procedure(s) Performed: Procedure(s): XI ROBOT ASSITED LAPAROSCOPIC NEPHROURETERECTOMY (Right)  Patient Location: PACU  Anesthesia Type:General  Level of Consciousness: awake, alert  and oriented  Airway & Oxygen Therapy: Patient Spontanous Breathing and Patient connected to face mask oxygen  Post-op Assessment: Report given to RN  Post vital signs: Reviewed and stable  Last Vitals:  Vitals:   11/15/16 0717 11/15/16 1148  BP: (!) 158/85   Pulse: 67 84  Resp: 16   Temp: 37 C     Last Pain:  Vitals:   11/15/16 0733  TempSrc:   PainSc: 0-No pain      Patients Stated Pain Goal: 3 (42/87/68 1157)  Complications: No apparent anesthesia complications

## 2016-11-15 NOTE — Anesthesia Preprocedure Evaluation (Deleted)
Anesthesia Evaluation  Patient identified by MRN, date of birth, ID band Patient awake    Reviewed: Allergy & Precautions, NPO status , Patient's Chart, lab work & pertinent test results  Airway Mallampati: II  TM Distance: >3 FB Neck ROM: Full    Dental no notable dental hx.    Pulmonary neg pulmonary ROS, former smoker,    Pulmonary exam normal breath sounds clear to auscultation       Cardiovascular hypertension, + CAD, + Past MI and + Cardiac Stents  Normal cardiovascular exam Rhythm:Regular Rate:Normal     Neuro/Psych CVA, Residual Symptoms negative psych ROS   GI/Hepatic negative GI ROS, Neg liver ROS,   Endo/Other  diabetes  Renal/GU negative Renal ROS  negative genitourinary   Musculoskeletal negative musculoskeletal ROS (+)   Abdominal   Peds negative pediatric ROS (+)  Hematology negative hematology ROS (+)   Anesthesia Other Findings   Reproductive/Obstetrics negative OB ROS                             Anesthesia Physical Anesthesia Plan  ASA: III  Anesthesia Plan: General   Post-op Pain Management:    Induction: Intravenous  PONV Risk Score and Plan: 1 and Ondansetron, Dexamethasone and Treatment may vary due to age or medical condition  Airway Management Planned: Oral ETT  Additional Equipment:   Intra-op Plan:   Post-operative Plan: Extubation in OR  Informed Consent: I have reviewed the patients History and Physical, chart, labs and discussed the procedure including the risks, benefits and alternatives for the proposed anesthesia with the patient or authorized representative who has indicated his/her understanding and acceptance.   Dental advisory given  Plan Discussed with: CRNA and Surgeon  Anesthesia Plan Comments:         Anesthesia Quick Evaluation

## 2016-11-15 NOTE — Anesthesia Procedure Notes (Signed)
Procedure Name: Intubation Date/Time: 11/15/2016 9:09 AM Performed by: Gaston Islam EVETTE Pre-anesthesia Checklist: Patient identified, Suction available and Patient being monitored Patient Re-evaluated:Patient Re-evaluated prior to inductionOxygen Delivery Method: Circle system utilized Preoxygenation: Pre-oxygenation with 100% oxygen Intubation Type: IV induction Ventilation: Mask ventilation without difficulty Laryngoscope Size: Mac and 4 Grade View: Grade I Tube type: Oral Tube size: 7.5 mm Number of attempts: 1 Airway Equipment and Method: Patient positioned with wedge pillow Placement Confirmation: ETT inserted through vocal cords under direct vision,  positive ETCO2,  CO2 detector and breath sounds checked- equal and bilateral Secured at: 22 cm Tube secured with: Tape Dental Injury: Teeth and Oropharynx as per pre-operative assessment

## 2016-11-15 NOTE — Brief Op Note (Signed)
11/15/2016  11:33 AM  PATIENT:  Tina Dyer  76 y.o. female  PRE-OPERATIVE DIAGNOSIS:  RIGHT RENAL PELVIS CANCER  POST-OPERATIVE DIAGNOSIS:  RIGHT RENAL PELVIS CANCER  PROCEDURE:  Procedure(s): XI ROBOT ASSITED LAPAROSCOPIC NEPHROURETERECTOMY (Right)  SURGEON:  Surgeon(s) and Role:    Alexis Frock, MD - Primary  PHYSICIAN ASSISTANT:   ASSISTANTS: Clemetine Marker PA   ANESTHESIA:   local and general  EBL:  Total I/O In: 1600 [I.V.:1600] Out: 950 [Urine:850; Blood:100]  BLOOD ADMINISTERED:none  DRAINS: foley to gravity   LOCAL MEDICATIONS USED:  MARCAINE     SPECIMEN:  Source of Specimen:  Rt kidney + ureter  DISPOSITION OF SPECIMEN:  PATHOLOGY  COUNTS:  YES  TOURNIQUET:  * No tourniquets in log *  DICTATION: .Other Dictation: Dictation Number [id number]  PLAN OF CARE: Admit to inpatient   PATIENT DISPOSITION:  PACU - hemodynamically stable.   Delay start of Pharmacological VTE agent (>24hrs) due to surgical blood loss or risk of bleeding: yes

## 2016-11-15 NOTE — Anesthesia Preprocedure Evaluation (Addendum)
Anesthesia Evaluation    Airway Mallampati: II  TM Distance: >3 FB Neck ROM: Full    Dental no notable dental hx.    Pulmonary former smoker,    Pulmonary exam normal breath sounds clear to auscultation       Cardiovascular hypertension, Normal cardiovascular exam Rhythm:Regular Rate:Normal     Neuro/Psych    GI/Hepatic   Endo/Other  diabetes  Renal/GU      Musculoskeletal   Abdominal   Peds  Hematology   Anesthesia Other Findings   Reproductive/Obstetrics                             Anesthesia Physical Anesthesia Plan  ASA: II  Anesthesia Plan: General   Post-op Pain Management:    Induction: Intravenous  PONV Risk Score and Plan:   Airway Management Planned:   Additional Equipment:   Intra-op Plan:   Post-operative Plan: Extubation in OR  Informed Consent: I have reviewed the patients History and Physical, chart, labs and discussed the procedure including the risks, benefits and alternatives for the proposed anesthesia with the patient or authorized representative who has indicated his/her understanding and acceptance.   Dental advisory given  Plan Discussed with: CRNA  Anesthesia Plan Comments:         Anesthesia Quick Evaluation

## 2016-11-15 NOTE — H&P (Signed)
Tina Dyer is an 76 y.o. female.    Chief Complaint: Pre-op RIGHT Robotic Nephroureterectomy  HPI:   1 - Gross Hematuria - new gross hematuria / right flank pain 06/2016. Non-con CT with Rt renal pelvis mass as per below. Cr 0.8. UA without infectious parameters. 1 artery (superior), 2 vein (gonadal inserts into lower) right renovascular anatomy.   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 JJ stent left in situ. Path low grade, but volume and orientation (broad nodular base) not amenable to complete endoscopic resection. Baseline Cr 1.2.   3 - Nephrolithiasis - small Rt lower pole non-obstructing stone (73mm) by CT 06/2016 at time of hematuria. No contralateral stones.   PMH sig for CAD/MI, CVA (Rt sided paressi), COPD (no O2, no longer smokes), DM2, TAH, Open Chole, Ortho Surgery. She lives with her grand-daughter who is very involved.   Today "Tina Dyer" is seen to proceed with RIGHT robotic nephroureterectomy. No interval fevers.    Past Medical History:  Diagnosis Date  . Arthritis   . Chronic leg pain    right leg nerve pain  . COPD (chronic obstructive pulmonary disease) ([place])   . Coronary artery disease last cardiologist visit 01 / 2017 approx.  in [place] (pt was living w/ her daughter)   pt hx MI 2001 w/ no intervention/  2002 cardiac cath w/ stenting --- pt currently is monitored by pcp  . Family history of adverse reaction to anesthesia    daughter- had problems with breathing   . History of kidney stones   . History of MI (myocardial infarction) 2001   in setting ishcemic CVA  . History of stroke with current residual effects 2001   left side ischemic stroke w/ right hemiplegia residual  . Hyperlipidemia, mixed   . Hypertension   . Insomnia   . Myocardial infarction ([place]) 2001  . Nephrolithiasis    bilateral non-obstructive per ct 09-21-2016  . Pulmonary nodules    RUL,  RLL,  LLL  per CT 09-20-2015 w/ [facility] ,   currently monitored by pcp  . Right hemiplegia ([place])    2001  residual ischemic stroke   . Right renal mass    renal pelvis  . S/P coronary artery stent placement    2002  unknown details but was done at [place]  . Stroke [facility]) 2002   affected right arm and leg   . Type 2 diabetes mellitus ([place])    type II   . Wears glasses     Past Surgical History:  Procedure Laterality Date  . CARPAL TUNNEL RELEASE  1981  . CATARACT EXTRACTION W/ INTRAOCULAR LENS  IMPLANT, BILATERAL    . CHOLECYSTECTOMY OPEN  1964  . CORONARY ANGIOPLASTY WITH STENT PLACEMENT  2002   at [facility] per family  . CYSTOSCOPY WITH RETROGRADE PYELOGRAM, URETEROSCOPY AND STENT PLACEMENT Right 10/11/2016   Procedure: CYSTOSCOPY WITH RETROGRADE PYELOGRAM, URETEROSCOPY WITH BIOPSY AND STENT PLACEMENT;  Surgeon: Alexis Frock, MD;  Location: [facility];  Service: Urology;  Laterality: Right;  . ORIF ANKLE FRACTURE Left 1991  . UNKNOWN APPROACH HYSTERECTOMY W/ BILATERAL SALPINGOOPHORECTOMY  1982    No family history on file. Social History:  reports that she quit smoking about 4 years ago. Her smoking use included Cigarettes. She has a 100.00 pack-year smoking history. She has never used smokeless tobacco. She reports that she does not drink alcohol or use drugs.  Allergies:  Allergies  Allergen Reactions  . Sulfa Antibiotics Hives    blisters  . Codeine Hives  . Latex Hives  . Penicillins Hives    Has patient had a PCN reaction causing immediate rash, facial/tongue/throat swelling, SOB or lightheadedness with hypotension: No Has patient had a PCN reaction causing severe rash involving mucus membranes or skin necrosis: No Has patient had a PCN reaction that required hospitalization No Has patient had a PCN reaction occurring within the last 10 years: No If all of the above answers are "NO", then may proceed with Cephalosporin use.      No prescriptions prior to admission.    No results  found for this or any previous visit (from the past 48 hour(s)). No results found.  ROS  There were no vitals taken for this visit. Physical Exam  Constitutional: She appears well-developed.  Some stigmata of prior CVA with Rt sided partial paresis.   HENT:  Head: Normocephalic.  Eyes: Pupils are equal, round, and reactive to light.  Neck: Normal range of motion.  Cardiovascular: Normal rate.   Respiratory: Effort normal.  GI: Soft.  Genitourinary:  Genitourinary Comments: NO CVAT.   Musculoskeletal: Normal range of motion.  Neurological: She is alert.  Skin: Skin is warm.  Psychiatric: She has a normal mood and affect. Her behavior is normal.     Assessment/Plan  Proceed as planned with RIGHT robotic nephroureterectomy. Risks, benefits, alternatives, expected peri-op course discussed previously and reiterated today.   Alexis Frock, MD 11/15/2016, 6:28 AM

## 2016-11-16 LAB — GLUCOSE, CAPILLARY
GLUCOSE-CAPILLARY: 139 mg/dL — AB (ref 65–99)
GLUCOSE-CAPILLARY: 141 mg/dL — AB (ref 65–99)
GLUCOSE-CAPILLARY: 89 mg/dL (ref 65–99)
GLUCOSE-CAPILLARY: 119 mg/dL — AB (ref 65–99)
Glucose-Capillary: 101 mg/dL — ABNORMAL HIGH (ref 65–99)

## 2016-11-16 LAB — BASIC METABOLIC PANEL
ANION GAP: 8 (ref 5–15)
BUN: 11 mg/dL (ref 6–20)
CHLORIDE: 103 mmol/L (ref 101–111)
CO2: 26 mmol/L (ref 22–32)
Calcium: 8.4 mg/dL — ABNORMAL LOW (ref 8.9–10.3)
Creatinine, Ser: 1.37 mg/dL — ABNORMAL HIGH (ref 0.44–1.00)
GFR calc non Af Amer: 37 mL/min — ABNORMAL LOW (ref >60)
GFR, EST AFRICAN AMERICAN: 43 mL/min — AB (ref >60)
Glucose, Bld: 148 mg/dL — ABNORMAL HIGH (ref 65–99)
POTASSIUM: 4.2 mmol/L (ref 3.5–5.1)
SODIUM: 137 mmol/L (ref 135–145)

## 2016-11-16 LAB — HEMOGLOBIN AND HEMATOCRIT, BLOOD
HEMATOCRIT: 29.6 % — AB (ref 36.0–46.0)
HEMOGLOBIN: 9.9 g/dL — AB (ref 12.0–15.0)

## 2016-11-16 MED ORDER — HYDROMORPHONE HCL 1 MG/ML IJ SOLN
0.5000 mg | INTRAMUSCULAR | Status: DC | PRN
  Administered 2016-11-16: 1 mg via INTRAVENOUS
  Administered 2016-11-17: 0.5 mg via INTRAVENOUS
  Administered 2016-11-17 (×2): 1 mg via INTRAVENOUS
  Administered 2016-11-17: 0.5 mg via INTRAVENOUS
  Administered 2016-11-17 – 2016-11-18 (×5): 1 mg via INTRAVENOUS
  Filled 2016-11-16 (×5): qty 1
  Filled 2016-11-16: qty 0.5
  Filled 2016-11-16 (×3): qty 1
  Filled 2016-11-16 (×2): qty 0.5

## 2016-11-16 NOTE — Progress Notes (Signed)
Pt ambulated around 160 feet in hallway this AM wearing her leg brace and using her personal A frame walker. Pt tolerated very well- denied pain & discomfort. Asking for eggs and grits for breakfast. Clear liquid tray ordered. Pt sitting up in chair at this time.

## 2016-11-16 NOTE — Clinical Social Work Placement (Signed)
   CLINICAL SOCIAL WORK PLACEMENT  NOTE  Date:  11/16/2016  Patient Details  Name: ELLEANNA MELLING MRN: [id number] Date of Birth: [birth year]  Clinical Social Work is seeking post-discharge placement for this patient at the [place] level of care (*CSW will initial, date and re-position this form in  chart as items are completed):  Yes   Patient/family provided with [place] Work Department's list of facilities offering this level of care within the geographic area requested by the patient (or if unable, by the patient's family).  Yes   Patient/family informed of their freedom to choose among providers that offer the needed level of care, that participate in Medicare, Medicaid or managed care program needed by the patient, have an available bed and are willing to accept the patient.  Yes   Patient/family informed of [facility]'s ownership interest in [facility] and [facility], as well as of the fact that they are under no obligation to receive care at these facilities.  PASRR submitted to EDS on 11/16/16     PASRR number received on 11/16/16     Existing PASRR number confirmed on       FL2 transmitted to all facilities in geographic area requested by pt/family on 11/16/16     FL2 transmitted to all facilities within larger geographic area on       Patient informed that his/her managed care company has contracts with or will negotiate with certain facilities, including the following:            Patient/family informed of bed offers received.  Patient chooses bed at       Physician recommends and patient chooses bed at      Patient to be transferred to   on  .  Patient to be transferred to facility by       Patient family notified on   of transfer.  Name of family member notified:        PHYSICIAN       Additional Comment:    _______________________________________________ Standley Brooking, LCSW 11/16/2016, 2:53  PM   Raynaldo Opitz, LCSW Clinical Social Worker cell #: [phone] (weekend coverage)

## 2016-11-16 NOTE — NC FL2 (Signed)
[place] LEVEL OF CARE SCREENING TOOL     IDENTIFICATION  Patient Name: Tina Dyer Birthdate: 18-Feb-1941 Sex: female Admission Date (Current Location): 11/15/2016  [facility] and [place] Number:  Herbalist and Address:  [facility],  [place] [address] [place]      Provider Number: [id number]  Attending Physician Name and Address:  Alexis Frock, MD  Relative Name and Phone Number:       Current Level of Care: Hospital Recommended Level of Care: [place] Prior Approval Number:    Date Approved/Denied:   PASRR Number: [id number]  Discharge Plan: SNF    Current Diagnoses: Patient Active Problem List   Diagnosis Date Noted  . Renal mass 11/15/2016    Orientation RESPIRATION BLADDER Height & Weight     Self, Time, Situation, Place  O2 (2L) Continent Weight: 151 lb (68.5 kg) Height:  5\' 5"  (165.1 cm)  BEHAVIORAL SYMPTOMS/MOOD NEUROLOGICAL BOWEL NUTRITION STATUS      Continent Diet (Clear Liquid)  AMBULATORY STATUS COMMUNICATION OF NEEDS Skin   Limited Assist Verbally Surgical wounds (Incision (Closed) 11/15/16 Abdomen)                       Personal Care Assistance Level of Assistance  Bathing, Dressing Bathing Assistance: Limited assistance   Dressing Assistance: Limited assistance     Functional Limitations Info             Maria Antonia  PT (By licensed PT), OT (By licensed OT)     PT Frequency: 5 OT Frequency: 5            Contractures      Additional Factors Info  Code Status, Allergies Code Status Info: Fullcode Allergies Info:  Sulfa Antibiotics, Codeine, Latex, Penicillins           Current Medications (11/16/2016):  This is the current hospital active medication list Current Facility-Administered Medications  Medication Dose Route Frequency Provider Last Rate Last Dose  . 0.45 % sodium chloride infusion   Intravenous Continuous Debbrah Alar,  PA-C 100 mL/hr at 11/16/16 1132    . albuterol (PROVENTIL) (2.5 MG/3ML) 0.083% nebulizer solution 2.5 mg  2.5 mg Nebulization Q4H PRN Alexis Frock, MD      . amLODipine (NORVASC) tablet 5 mg  5 mg Oral Daily Debbrah Alar, PA-C   5 mg at 11/16/16 1112  . atorvastatin (LIPITOR) tablet 80 mg  80 mg Oral QHS Debbrah Alar, PA-C   80 mg at 11/15/16 2234  . citalopram (CELEXA) tablet 10 mg  10 mg Oral Daily Debbrah Alar, PA-C   10 mg at 11/16/16 1113  . diphenhydrAMINE (BENADRYL) injection 12.5 mg  12.5 mg Intravenous Q6H PRN Dancy, Amanda, PA-C       Or  . diphenhydrAMINE (BENADRYL) 12.5 MG/5ML elixir 12.5 mg  12.5 mg Oral Q6H PRN Dancy, Amanda, PA-C      . gabapentin (NEURONTIN) capsule 300 mg  300 mg Oral QID Debbrah Alar, PA-C   300 mg at 11/16/16 1113  . HYDROmorphone (DILAUDID) injection 0.5-1 mg  0.5-1 mg Intravenous Q2H PRN Debbrah Alar, PA-C   0.5 mg at 11/16/16 1239  . HYDROmorphone (DILAUDID) injection 0.5-1 mg  0.5-1 mg Intravenous Q2H PRN Alexis Frock, MD      . insulin aspart (novoLOG) injection 0-15 Units  0-15 Units Subcutaneous TID WC Debbrah Alar, PA-C   2 Units at 11/16/16 6283  . lactated ringers  infusion   Intravenous Continuous Lynda Rain[place], MD   Stopped at 11/15/16 1506  . mometasone-formoterol (DULERA) 200-5 MCG/ACT inhaler 2 puff  2 puff Inhalation BID Debbrah Alar, PA-C   2 puff at 11/16/16 1054  . ondansetron (ZOFRAN) injection 4 mg  4 mg Intravenous Q4H PRN Debbrah Alar, PA-C   4 mg at 11/16/16 0516  . pantoprazole (PROTONIX) EC tablet 40 mg  40 mg Oral Daily Debbrah Alar, PA-C   40 mg at 11/16/16 1114  . rOPINIRole (REQUIP) tablet 0.5 mg  0.5 mg Oral Q24H Alexis Frock, MD   0.5 mg at 11/15/16 1818   And  . rOPINIRole (REQUIP) tablet 0.75 mg  0.75 mg Oral Q24H Alexis Frock, MD   0.75 mg at 11/15/16 2235  . traMADol (ULTRAM) tablet 50-100 mg  50-100 mg Oral Q6H PRN Debbrah Alar, PA-C   100 mg at 11/15/16 1502  . umeclidinium bromide (INCRUSE  ELLIPTA) 62.5 MCG/INH 1 puff  1 puff Inhalation Daily Debbrah Alar, PA-C   1 puff at 11/16/16 1056     Discharge Medications: Please see discharge summary for a list of discharge medications.  Relevant Imaging Results:  Relevant Lab Results:   Additional Information SSN: [id number]  Standley Brooking, LCSW

## 2016-11-16 NOTE — Progress Notes (Signed)
PT Cancellation Note  Patient Details Name: Tina Dyer MRN: [id number] DOB: [birth year]   Cancelled Treatment:    Reason Eval/Treat Not Completed: Fatigue/lethargy limiting ability to participate; patient already up and ambulated this AM with nursing.  Reports sat up and ate breakfast, but then vomited back up.  Resting currently, will attempt to see later.   Reginia Naas 11/16/2016, 10:03 AM  Magda Kiel, [place] 11/16/2016

## 2016-11-16 NOTE — Progress Notes (Signed)
1 Day Post-Op Subjective: Patient reports mild abdominal pain. 1 episode of emesis after eating broth this morning. hgb 9.9, creatinine 1.37. 2L UOP  Objective: Vital signs in last 24 hours: Temp:  [95.8 F (35.4 C)-98.4 F (36.9 C)] 98.4 F (36.9 C) (06/23 0409) Pulse Rate:  [66-87] 67 (06/23 0409) Resp:  [13-18] 14 (06/23 0409) BP: (107-164)/(41-79) 131/65 (06/23 0409) SpO2:  [93 %-100 %] 98 % (06/23 0409)  Intake/Output from previous day: 06/22 0701 - 06/23 0700 In: 3779.6 [P.O.:570; I.V.:3209.6] Out: 2401 [Urine:2150; Emesis/NG output:1; Blood:100] Intake/Output this shift: No intake/output data recorded.  Physical Exam:  General:alert, cooperative and appears stated age GI: soft, distended and tenderness: suprapubic Female genitalia: not done Extremities: extremities normal, atraumatic, no cyanosis or edema  Lab Results:  Recent Labs  11/15/16 1211 11/16/16 0509  HGB 11.3* 9.9*  HCT 33.5* 29.6*   BMET  Recent Labs  11/16/16 0509  NA 137  K 4.2  CL 103  CO2 26  GLUCOSE 148*  BUN 11  CREATININE 1.37*  CALCIUM 8.4*   No results for input(s): LABPT, INR in the last 72 hours. No results for input(s): LABURIN in the last 72 hours. No results found for this or any previous visit.  Studies/Results: No results found.  Assessment/Plan: POD#1 right robotic nephroureterectomy  1. Continue CLD 2. D/c foley 3. Likely discharge Sunday versus monday   LOS: 1 day   Nicolette Bang 11/16/2016, 8:45 AM

## 2016-11-16 NOTE — Clinical Social Work Note (Signed)
Clinical Social Work Assessment  Patient Details  Name: Tina Dyer MRN: [id number] Date of Birth: 07-29-1940  Date of referral:  11/16/16               Reason for consult:  Facility Placement                Permission sought to share information with:  Facility Art therapist granted to share information::  Yes, Verbal Permission Granted  Name::        Agency::     Relationship::     Contact Information:     Housing/Transportation Living arrangements for the past 2 months:  Apartment Source of Information:  Patient, Other (Comment Required) (Patient's grand-daughter, Tina Dyer at bedside) Patient Interpreter Needed:  None Criminal Activity/Legal Involvement Pertinent to Current Situation/Hospitalization:  No - Comment as needed Significant Relationships:  Friend, Other Family Members Lives with:  Other (Comment) (patient's grand-daughter, Tina Dyer) Do you feel safe going back to the place where you live?  No Need for family participation in patient care:  Yes (Comment)  Care giving concerns:  CSW received consult for SNF placement, but noted that PT recommended HH PT; Supervision - intermittent. CSW spoke with patient & grand-daughter, Tina Dyer to confirm discharge plans. Tina Dyer states that she has been concerned about patient returning home at discharge as she will not be there all the time. CSW explained process for SNF placement and need for 3-night stay in hospital for Medicare to cover SNF stay. Patient was admitted on 6/22 and would have to be able to stay until Monday for SNF to be covered. Patient & grand-daughter aware.    Social Worker assessment / plan:    Employment status:  Retired Health visitor PT Recommendations:  Home with [place] / Referral to community resources:  [place]  Patient/Family's Response to care:    Patient/Family's Understanding of and Emotional Response to Diagnosis, Current  Treatment, and Prognosis:    Emotional Assessment Appearance:  Appears stated age Attitude/Demeanor/Rapport:    Affect (typically observed):    Orientation:  Oriented to Self, Oriented to Place, Oriented to  Time, Oriented to Situation Alcohol / Substance use:    Psych involvement (Current and /or in the community):     Discharge Needs  Concerns to be addressed:    Readmission within the last 30 days:    Current discharge risk:    Barriers to Discharge:      Standley Brooking, LCSW 11/16/2016, 2:50 PM

## 2016-11-16 NOTE — Evaluation (Signed)
Physical Therapy Evaluation Patient Details Name: Tina Dyer MRN: [id number] DOB: 03-05-1941 Today's Date: 11/16/2016   History of Present Illness  Patient is a 76 y/o female admitted due to gross hematuria, nephrolithiasis and R renal pelvis cancer.  Now s/p XI ROBOT ASSITED LAPAROSCOPIC NEPHROURETERECTOMY (Right).  Clinical Impression  Patient presents with decreased mobility mainly due to pain.  She will benefit from skilled PT in the acute setting to address issues and allow d/c home with follow up HHPT.  Currently minguard to S level for ambulation.      Follow Up Recommendations Home health PT;Supervision - Intermittent    Equipment Recommendations  None recommended by PT    Recommendations for Other Services       Precautions / Restrictions Precautions Precautions: Fall Required Braces or Orthoses: Other Brace/Splint Other Brace/Splint: R AFO      Mobility  Bed Mobility Overal bed mobility: Needs Assistance Bed Mobility: Rolling;Sidelying to Sit;Sit to Supine Rolling: Supervision Sidelying to sit: Min assist   Sit to supine: Min assist   General bed mobility comments: cues for technique, assist to support trunk, pt needing increased time to push up, to supine assist for R LE  Transfers Overall transfer level: Needs assistance Equipment used: Hemi-walker Transfers: Sit to/from Stand Sit to Stand: Supervision         General transfer comment: increased time  Ambulation/Gait Ambulation/Gait assistance: Supervision;Min guard Ambulation Distance (Feet): 200 Feet Assistive device: Hemi-walker Gait Pattern/deviations: Step-to pattern;Decreased stride length     General Gait Details: assist initially for safety, but pt stable with ambulation without assist  Stairs Stairs: Yes Stairs assistance: Min assist Stair Management: One rail Right;One rail Left;Step to pattern;Forwards Number of Stairs: 5 General stair comments: limited due to length of IV  tubing; reports grandaughter assist with stairs  Wheelchair Mobility    Modified Rankin (Stroke Patients Only)       Balance Overall balance assessment: Needs assistance   Sitting balance-Leahy Scale: Good Sitting balance - Comments: able to don shoes/brace usually, assisted today some due to pain   Standing balance support: No upper extremity supported;Single extremity supported Standing balance-Leahy Scale: Fair Standing balance comment: static standing without UE support                             Pertinent Vitals/Pain Pain Assessment: 0-10 Pain Score: 8  Pain Location: abdomen Pain Descriptors / Indicators: Sharp;Sore Pain Intervention(s): Monitored during session;Repositioned;Patient requesting pain meds-RN notified    Home Living Family/patient expects to be discharged to:: Private residence Living Arrangements: Other relatives Advertising account executive) Available Help at Discharge: Family;Available PRN/intermittently Type of Home: Apartment Home Access: Stairs to enter Entrance Stairs-Rails: Right;Left Entrance Stairs-Number of Steps: 13 Home Layout: One level Home Equipment: Wheelchair - power;Wheelchair - manual;Tub bench;Other (comment) Additional Comments: hemiwalker    Prior Function Level of Independence: Independent with assistive device(s)               Hand Dominance        Extremity/Trunk Assessment   Upper Extremity Assessment Upper Extremity Assessment: RUE deficits/detail RUE Deficits / Details: flexion synergy and basically nonfunctional    Lower Extremity Assessment Lower Extremity Assessment: RLE deficits/detail RLE Deficits / Details: extensor tone/synergy       Communication   Communication: No difficulties  Cognition Arousal/Alertness: Awake/alert Behavior During Therapy: WFL for tasks assessed/performed Overall Cognitive Status: Within Functional Limits for tasks assessed  General Comments General comments (skin integrity, edema, etc.): ambulated on RA, SpO2 93%, upon return to supine SpO2 87% so reapplied O2 at 2LPM    Exercises     Assessment/Plan    PT Assessment Patient needs continued PT services  PT Problem List Decreased activity tolerance;Pain;Decreased mobility;Cardiopulmonary status limiting activity       PT Treatment Interventions Gait training;DME instruction;Therapeutic activities;Therapeutic exercise;Patient/family education;Stair training;Balance training;Functional mobility training    PT Goals (Current goals can be found in the Care Plan section)  Acute Rehab PT Goals Patient Stated Goal: To go home PT Goal Formulation: With patient Time For Goal Achievement: 11/23/16 Potential to Achieve Goals: Good    Frequency Min 3X/week   Barriers to discharge        Co-evaluation               AM-PAC PT "6 Clicks" Daily Activity  Outcome Measure Difficulty turning over in bed (including adjusting bedclothes, sheets and blankets)?: A Lot Difficulty moving from lying on back to sitting on the side of the bed? : Total Difficulty sitting down on and standing up from a chair with arms (e.g., wheelchair, bedside commode, etc,.)?: Total Help needed moving to and from a bed to chair (including a wheelchair)?: A Little Help needed walking in hospital room?: A Little Help needed climbing 3-5 steps with a railing? : A Little 6 Click Score: 13    End of Session Equipment Utilized During Treatment: Oxygen Activity Tolerance: Patient tolerated treatment well Patient left: in bed;with call bell/phone within reach Nurse Communication: Patient requests pain meds PT Visit Diagnosis: Difficulty in walking, not elsewhere classified (R26.2);Pain Pain - part of body:  (abdomen)    Time: 6381-7711 PT Time Calculation (min) (ACUTE ONLY): 34 min   Charges:   PT Evaluation $PT Eval Moderate Complexity: 1 Procedure PT Treatments $Gait  Training: 8-22 mins   PT G CodesMagda Kiel, [place] [phone] 11/16/2016   Reginia Naas 11/16/2016, 12:50 PM

## 2016-11-16 NOTE — Progress Notes (Signed)
Patient preferred not to ambulate before going to sleep this shift. Pt vomited x1 immediately after taking scheduled PO Tylenol and drinking coffee. PRN Zofran effective. Pt up to [facility] several multiple times this shift as well as sitting up on side of bed. Will attempt again to ambulate patient in hall in AM.

## 2016-11-17 LAB — HEMOGLOBIN AND HEMATOCRIT, BLOOD
HEMATOCRIT: 28.5 % — AB (ref 36.0–46.0)
HEMOGLOBIN: 9.4 g/dL — AB (ref 12.0–15.0)

## 2016-11-17 LAB — BASIC METABOLIC PANEL
ANION GAP: 2 — AB (ref 5–15)
BUN: 12 mg/dL (ref 6–20)
CALCIUM: 8.2 mg/dL — AB (ref 8.9–10.3)
CO2: 27 mmol/L (ref 22–32)
Chloride: 106 mmol/L (ref 101–111)
Creatinine, Ser: 1.53 mg/dL — ABNORMAL HIGH (ref 0.44–1.00)
GFR, EST AFRICAN AMERICAN: 38 mL/min — AB (ref >60)
GFR, EST NON AFRICAN AMERICAN: 32 mL/min — AB (ref >60)
Glucose, Bld: 87 mg/dL (ref 65–99)
Potassium: 4.1 mmol/L (ref 3.5–5.1)
SODIUM: 135 mmol/L (ref 135–145)

## 2016-11-17 LAB — GLUCOSE, CAPILLARY
GLUCOSE-CAPILLARY: 124 mg/dL — AB (ref 65–99)
Glucose-Capillary: 101 mg/dL — ABNORMAL HIGH (ref 65–99)
Glucose-Capillary: 118 mg/dL — ABNORMAL HIGH (ref 65–99)
Glucose-Capillary: 136 mg/dL — ABNORMAL HIGH (ref 65–99)

## 2016-11-17 MED ORDER — PROMETHAZINE HCL 25 MG/ML IJ SOLN
12.5000 mg | Freq: Four times a day (QID) | INTRAMUSCULAR | Status: DC | PRN
  Administered 2016-11-17: 12.5 mg via INTRAVENOUS
  Filled 2016-11-17: qty 1

## 2016-11-17 NOTE — Progress Notes (Signed)
Patient on continuous pulse ox- O2 Sats have dropped x2 to 84-86% 2L/[place]. O2 Sats then quickly increase back to 93-95% 2L/[place] after patient takes several deep breaths. Patient denies SOB or difficulty breathing. Difficult for patient to take deep breaths when in pain. Patient did sit up in chair this shift and ambulate around room. Will pass to oncoming RN to make MD aware.

## 2016-11-17 NOTE — Progress Notes (Signed)
2 Days Post-Op Subjective: Patient reports abd pain, nausea. Crampy, diffuse abd pain. Emesis x 2 today. Voiding in bedside commode without difficulty. She has passed flatus. On clears.   Objective: Vital signs in last 24 hours: Temp:  [98 F (36.7 C)-98.3 F (36.8 C)] 98 F (36.7 C) (06/24 0626) Pulse Rate:  [72-84] 72 (06/24 0626) Resp:  [20] 20 (06/24 0626) BP: (126-134)/(47-55) 126/47 (06/24 0626) SpO2:  [94 %-96 %] 94 % (06/24 0626)  Intake/Output from previous day: 06/23 0701 - 06/24 0700 In: 400 [P.O.:400] Out: 3650 [Urine:2650; Emesis/NG output:1000] Intake/Output this shift: Total I/O In: 365 [I.V.:365] Out: -   Physical Exam:  NAD A&Ox3 CV - RRR Resp - regular effort, depth Abd - very benign - soft, ND, NT with mild and deep palpation, excellent BS, incisions C/D/I Ext - scd's in place   Lab Results:  Recent Labs  11/15/16 1211 11/16/16 0509  HGB 11.3* 9.9*  HCT 33.5* 29.6*   BMET  Recent Labs  11/16/16 0509  NA 137  K 4.2  CL 103  CO2 26  GLUCOSE 148*  BUN 11  CREATININE 1.37*  CALCIUM 8.4*   No results for input(s): LABPT, INR in the last 72 hours. No results for input(s): LABURIN in the last 72 hours. No results found for this or any previous visit.  Studies/Results: No results found.  Assessment/Plan: -POD#2 - crampy abd/pain, N/V - benign exam, possibly ileus resolving -  -full liquids -low dose promethazine added to zofran -h/h, bmp sent -roommate says pt will have difficulty at home with stairs - will have PT eval    LOS: 2 days   Oline Belk 11/17/2016, 12:54 PM

## 2016-11-18 LAB — GLUCOSE, CAPILLARY: GLUCOSE-CAPILLARY: 104 mg/dL — AB (ref 65–99)

## 2016-11-18 NOTE — Discharge Summary (Addendum)
Physician Discharge Summary  Patient ID: Tina Dyer MRN: [id number] DOB/AGE: June 04, 1940 76 y.o.  Admit date: 11/15/2016 Discharge date: 11/18/2016  Admission Diagnoses: Right Renal Pelvis Cancer  Discharge Diagnoses:  Active Problems:   Renal mass   Discharged Condition: fair  Hospital Course: Pt underwent RIGHT robotic nephroureterectomy on 11/15/16, they day of admission, without acute complication. She was admitted to 4th floor Urology service post-op. She had mild ileus initially that resolved. By the morning of 6/25, the day of discharge, she is tolerating PO intake, ambulatory as per baseline (has h/o Rt sided hemiparesis) and felt to be adequate for discharge. DC Hgb 9.8, Cr 1.5, final pathology pending.  Given her lack of social support and poor mobility, PT has recommended DC to SNF. This is reasonable.    Consults: None  Significant Diagnostic Studies: labs: as per above  Treatments: surgery: RIGHT robotic nephroureterectomy 11/15/16.   Discharge Exam: Blood pressure (!) 163/78, pulse 89, temperature 98 F (36.7 C), temperature source Oral, resp. rate 18, height 5\' 5"  (1.651 m), weight 68.5 kg (151 lb), SpO2 94 %. General appearance: alert, cooperative and stigmata of prior CVA. At functional baseline.  Eyes: negative Nose: Nares normal. Septum midline. Mucosa normal. No drainage or sinus tenderness. Throat: lips, mucosa, and tongue normal; teeth and gums normal Neck: supple, symmetrical, trachea midline Back: symmetric, no curvature. ROM normal. No CVA tenderness. GI: soft, non-tender; bowel sounds normal; no masses,  no organomegaly Pelvic: external genitalia normal and no foley.  Extremities: extremities normal, atraumatic, no cyanosis or edema Skin: Skin color, texture, turgor normal. No rashes or lesions Lymph nodes: Cervical, supraclavicular, and axillary nodes normal. Neurologic: Grossly normal Incision/Wound: Rt sided port / extraction sites c/d/i. Some mild  bruising of skin w/o hematomas.   Disposition: Skilled Nursing.     Follow-up Information    Alexis Frock, MD On 11/25/2016.   Specialty:  Urology Why:  at 9 AM for MD visit and office catheter removal Contact information: [place] [address] [phone]           Signed: Alexis Frock 11/18/2016, 7:20 AM

## 2016-11-18 NOTE — Progress Notes (Signed)
Report called to facility. AVS and discharge paperwork sent with pt via [facility]. Upon leaving, pt alert and oriented, respirations even and unlabored. Belongings sent with pt.

## 2016-11-18 NOTE — Clinical Social Work Placement (Addendum)
Patient received and accepted bed offer at [facility]. Per SNF patient can arrive at 2:30pm. Patient to be transported via [facility]. Patient's RN can call report to [phone], patient going to room 201. [facility] contacted for 2:30PM transport, family aware.  CLINICAL SOCIAL WORK PLACEMENT  NOTE  Date:  11/18/2016  Patient Details  Name: Tina Dyer MRN: [id number] Date of Birth: 1940/08/02  Clinical Social Work is seeking post-discharge placement for this patient at the [place] level of care (*CSW will initial, date and re-position this form in  chart as items are completed):  Yes   Patient/family provided with [place] Work Department's list of facilities offering this level of care within the geographic area requested by the patient (or if unable, by the patient's family).  Yes   Patient/family informed of their freedom to choose among providers that offer the needed level of care, that participate in Medicare, Medicaid or managed care program needed by the patient, have an available bed and are willing to accept the patient.  Yes   Patient/family informed of [facility]'s ownership interest in [facility] and [facility], as well as of the fact that they are under no obligation to receive care at these facilities.  PASRR submitted to EDS on 11/16/16     PASRR number received on 11/16/16     Existing PASRR number confirmed on       FL2 transmitted to all facilities in geographic area requested by pt/family on 11/16/16     FL2 transmitted to all facilities within larger geographic area on       Patient informed that his/her managed care company has contracts with or will negotiate with certain facilities, including the following:        Yes   Patient/family informed of bed offers received.  Patient chooses bed at [facility]     Physician recommends and patient chooses bed at      Patient to be transferred to  [facility] on 11/18/16.  Patient to be transferred to facility by [facility]     Patient family notified on 11/18/16 of transfer.  Name of family member notified:  [name]     PHYSICIAN       Additional Comment:    _______________________________________________ Burnis Medin, LCSW 11/18/2016, 12:14 PM

## 2016-11-18 NOTE — Discharge Summary (Signed)
Physician Discharge Summary  Patient ID: Tina Dyer MRN: [id number] DOB/AGE: March 11, 1941 76 y.o.  Admit date: 11/15/2016 Discharge date: 11/18/2016  Admission Diagnoses: RIGHT Renal Pelvis Cancer  Discharge Diagnoses: RIGHT Renal Pelvis Cancer   Discharged Condition: fair  Hospital Course:   Pt underwent RIGHT robotic nephroureterectomy on 11/15/16, they day of admission, without acute complication. She was admitted to 4th floor Urology service post-op. She had mild ileus initially that resolved. By the morning of 6/25, the day of discharge, she is tolerating PO intake, ambulatory as per baseline (has h/o Rt sided hemiparesis) and felt to be adequate for discharge. DC Hgb 9.8, Cr 1.5, final pathology pending.  Given her lack of social support and poor mobility, PT has recommended DC to SNF. This is reasonable.     Consults: PT  Significant Diagnostic Studies: labs: as per HPI  Treatments: surgery:  As per HPI  Discharge Exam: Blood pressure (!) 163/78, pulse 89, temperature 98 F (36.7 C), temperature source Oral, resp. rate 18, height 5\' 5"  (1.651 m), weight 71.3 kg (157 lb 3 oz), SpO2 95 %.   General appearance: alert, cooperative and stigmata of prior CVA. At functional baseline.  Eyes: negative Nose: Nares normal. Septum midline. Mucosa normal. No drainage or sinus tenderness. Throat: lips, mucosa, and tongue normal; teeth and gums normal Neck: supple, symmetrical, trachea midline Back: symmetric, no curvature. ROM normal. No CVA tenderness. GI: soft, non-tender; bowel sounds normal; no masses,  no organomegaly Pelvic: external genitalia normal and no foley.  Extremities: extremities normal, atraumatic, no cyanosis or edema Skin: Skin color, texture, turgor normal. No rashes or lesions Lymph nodes: Cervical, supraclavicular, and axillary nodes normal. Neurologic: Grossly normal Incision/Wound: Rt sided port / extraction sites c/d/i. Some mild bruising of skin w/o  hematomas.    Disposition: 01-Home or Self Care   Allergies as of 11/18/2016      Reactions   Sulfa Antibiotics Hives   blisters   Codeine Hives   Latex Hives   Penicillins Hives   Has patient had a PCN reaction causing immediate rash, facial/tongue/throat swelling, SOB or lightheadedness with hypotension: No Has patient had a PCN reaction causing severe rash involving mucus membranes or skin necrosis: No Has patient had a PCN reaction that required hospitalization No Has patient had a PCN reaction occurring within the last 10 years: No If all of the above answers are "NO", then may proceed with Cephalosporin use.      Medication List    STOP taking these medications   aspirin 81 MG chewable tablet   Vitamin D3 5000 units Caps     TAKE these medications   amLODipine 5 MG tablet Commonly known as:  NORVASC Take 1 tablet by mouth daily.   atorvastatin 80 MG tablet Commonly known as:  LIPITOR Take 80 mg by mouth at bedtime.   budesonide-formoterol 160-4.5 MCG/ACT inhaler Commonly known as:  SYMBICORT Inhale 2 puffs into the lungs 2 (two) times daily.   citalopram 10 MG tablet Commonly known as:  CELEXA Take 10 mg by mouth daily.   gabapentin 300 MG capsule Commonly known as:  NEURONTIN Take 300 mg by mouth 4 (four) times daily. Pt takes 300 mg in AM and 900 mg in PM   INCRUSE ELLIPTA 62.5 MCG/INH Aepb Generic drug:  umeclidinium bromide Inhale 1 puff into the lungs daily.   lisinopril 10 MG tablet Commonly known as:  PRINIVIL,ZESTRIL Take 10 mg by mouth daily.   metFORMIN 1000 MG tablet Commonly  known as:  GLUCOPHAGE Take 1,000 mg by mouth daily.   omeprazole 20 MG capsule Commonly known as:  PRILOSEC Take 20 mg by mouth daily.   ondansetron 4 MG disintegrating tablet Commonly known as:  ZOFRAN ODT Take 1 tablet (4 mg total) by mouth every 8 (eight) hours as needed for nausea or vomiting.   rOPINIRole 0.25 MG tablet Commonly known as:  REQUIP Take  0.5-0.75 mg by mouth 3 (three) times daily. 0.5 mg in the afternoon, and 0.75 at night (one at 1500, two at 1900, two at 2200)   traMADol 50 MG tablet Commonly known as:  ULTRAM Take 1-2 tablets (50-100 mg total) by mouth every 6 (six) hours as needed for moderate pain or severe pain.   VENTOLIN HFA 108 (90 Base) MCG/ACT inhaler Generic drug:  albuterol Inhale 2 puffs into the lungs every 4 (four) hours as needed for wheezing or shortness of breath.       Contact information for follow-up providers    Alexis Frock, MD Follow up on 11/25/2016.   Specialty:  Urology Why:  at 9 AM for MD visit.  Contact information: [place] [address] [phone]            Contact information for after-discharge care    Destination    [facility] SN[facility] .   Specialty:  [place] information: [place] [phone]                  Signed: Alexis Frock 11/18/2016, 11:12 AM

## 2016-11-18 NOTE — Op Note (Signed)
NAME:  Tina, Dyer                     ACCOUNT NO.:  MEDICAL RECORD NO.:  [id number]  LOCATION:                                 FACILITY:  PHYSICIAN:  Alexis Frock, MD          DATE OF BIRTH:  DATE OF PROCEDURE: 11/15/2016                              OPERATIVE REPORT   DIAGNOSIS:  Large volume right renal pelvis cancer.  PROCEDURE: 1. Robot-assisted laparoscopic right nephroureterectomy. 2. Laparoscopic extensive adhesiolysis.  ESTIMATED BLOOD LOSS:  100 mL.  COMPLICATION:  None.  SPECIMENS:  Right kidney and ureter for permanent pathology.  ASSISTANT:  Debbrah Alar, PA.  FINDINGS: 1. Extensive adhesions mostly omental and liver edge consistent with     prior open cholecystectomy. 2. 2 artery, 2 vein right renovascular anatomy. 3. Successful extubation, right kidney and ureter down to the     intramural ureter, formal bladder cuff not performed due to     unfavorable anatomy.  DRAINS:  Foley catheter straight drain.  INDICATION:  Ms. Horseman is a pleasant but unfortunate 76 year old lady with extensive medical comorbidities including COPD and right hemiparesis after a stroke, she was found on workup of gross hematuria to have very large volume right renal pelvis cancer, this is low grade but not amenable to endoscopic resection given the volume of tumor. She is symptomatic from recurrent gross hematuria with clots.  Her contralateral kidney is unremarkable, her overall renal function is excellent.  Options discussed for further management including attempted stage endoscopic ablation versus palliative only protocol versus surgical extirpation with nephroureterectomy with curative intent, and she wished to proceed with the latter.  Informed consent was obtained and placed in the medical record.  PROCEDURE IN DETAIL:  The patient being, Tina Dyer, verified and procedure being right nephroureterectomy was confirmed.  Procedure was carried out.  Time-out was  performed.  Intravenous antibiotics administered.  General endotracheal anesthesia introduced.  The patient was placed into a right side up full flank position, applying 15 degrees of stable flexion, superior arm elevator, axillary roll, sequential compression devices, bottom leg bent, top leg straight.  Exquisite care was taken to position her right arm as it she does have some mild contractures of this in her hand to avoid an undue tension on this.  She was further fashioned to the operating table using 3-inch tape over foam padding across her supraxiphoid chest and pelvis.  She was found to be suitably positioned.  Beanbag was deployed as was axillary roll. Sterile field was created by prepping the patient's right flank and abdomen using chlorhexidine gluconate and a high-flow, low-pressure pneumoperitoneum was obtained using Veress technique in the right lower quadrant having passed the aspiration and drop test.   Next, an 8- mm robotic camera port was placed in position approximately 3 fingerbreadths superolateral to the umbilicus.  Laparoscopic examination of the peritoneal cavity immediately revealed multifocal large volume adhesions, mostly it appeared to be omental in nature in all quadrants of the right hemi-abdomen and also some dense adhesions of the liver edge and abdominal wall consistent with prior cholecystectomy. Additional ports were placed as follows:  Right far lateral 8-mm  robotic port approximately 4 fingerbreadths superomedial to the anterior- superior iliac spine, right paramedian inferior robotic port approximately 1 handbreadth superior to the pubic ramus, and two 12 mm assistant ports in the midline, one in infraumbilical location and another 2 fingerbreadths above the camera port.  At this point, it was not possible to place the more superior portion due to the adhesions. Next, very careful adhesiolysis was performed using laparoscopic scissors for  approximately 45 minutes taking down mostly loose omental adhesions to the abdominal wall, taking exquisite care to avoid bowel injury which did not occur.  Additional adhesions were released away from the abdominal wall in area of prior cholecystectomy and lateral liver edge, this allowed much better visualization of the superior abdomen, and an additional 8-mm robotic camera port was placed in the subcostal location, and a 5-mm liver retraction port as well.  Robot was then docked and passed through the electronic checks.  Initial attention was directed at additional adhesiolysis of the inferior liver edge, this was very carefully mobilized away from the anterosuperior surface of Gerota fascia, allowing the liver to rotate superiorly and a self- locking grasper was placed across this to provide superior traction. Lower pole of the kidney area was identified, placed on gentle lateral traction.  Dissection proceeded medial to this.  The ureter was encountered.  It was quite capacious and was stented as expected, it was placed on gentle lateral traction.  Dissection proceeded within this triangle towards the area of the renal hilum.  The duodenum was encountered and Kocherized medially.  The hilum was somewhat complex as anticipated with the 2 artery, 2 vein renovascular anatomy.  The inferior most vein was controlled using vascular stapler, which allowed better visualization into the 2 arteries.  These were controlled using Weck clip proximally and vascular stapler distally involving the remaining superior vein which was the dominant vein, controlled with vascular load stapler.  Superior attachments were taken down using vascular stapler separating the inferior adrenal edge from the superior aspect of the kidney, resulted in excellent hemostatic control of the superior aspect of the dissection.  Lateral attachments and inferior attachments were then taken down, thus completely freeing up  the kidney portion of the specimen such that it was only attached by the ureter. This was then carefully traced inferiorly, passed the area of the iliac vessels towards the area of the deep pelvis.  Notably, the patient does have significant adhesions in the right lower quadrant as well, and very careful adhesiolysis was performed in the the area of the ascending colon and cecum and distal ilium medially due to the patient's right- sided hemiparesis and non-favorable positioning, a separate port was then placed somewhat inferior to the previous superior most robotic port site to allow better angulation of the deep pelvis and continued robotic dissection was performed circumferentially around the ureter towards the area of the bladder.  This was performed to the level of the intramural ureter, but again due to very unfavorable angulation to the patient's body type, hemiparesis, and contractures as well as intraabdominal adhesions; it was not felt that it would be safe for a risk and benefit perspective to perform formal bladder cuff, and given the patient's low- grade histology, this was felt to be oncologically prudent or satisfactory as well.  As such, the ureter was clipped proximally, and then incised for half of its circumference distally and the stent was delivered, and the final distal portion was controlled using Weck clip distally, resulted in excellent  apposition of the distal ureter and complete closure.  This completely freed up the nephroureterectomy specimen, was placed into an extra large EndoCatch bag for later retrieval.  The abdomen was once again inspected.  Hemostasis appeared excellent.  There was no visceral injury.  Robot was then undocked. Specimen was retrieved by extending the previous superior most assistant port site for distance of approximately 5 cm, removing the nephroureteral specimen and setting it aside for permanent pathology. This site was closed at the  level of the fascia using figure-of-eight PDS x5, all incision sites were infiltrated with dilute lyophilized Marcaine and closed at the level of the skin using subcuticular Monocryl followed by Dermabond. The procedure was then terminated.  The patient tolerated the procedure well.  There were no immediate periprocedural complications.  The patient was taken to the postanesthesia care in stable condition.          ______________________________ Alexis Frock, MD     TM/MEDQ  D:  11/15/2016  T:  11/15/2016  Job:  [id number]

## 2016-11-18 NOTE — Progress Notes (Signed)
CSW followed up with patient and patient's daughter about discharge plans. Patient's and patient's granddaughter reported that they are interested in SNF, CSW provided with bed offers. Patient and patient's daughter selected [facility]. CSW contacted [facility] and confirmed patient's bed offer. Per chart review, patient's discharge summary completed this am for home. CSW notified patient's attending MD about patient's plan to discharge to SNF, MD agreed to update discharge summary. CSW will continue to follow and assist with discharge planning.   Abundio Miu, [place] Social Worker [facility] Cell#: [phone]

## 2016-11-18 NOTE — Progress Notes (Signed)
Pt planning to discharge to SNF, CSW following.

## 2016-11-28 ENCOUNTER — Encounter (HOSPITAL_COMMUNITY): Payer: Self-pay | Admitting: Emergency Medicine

## 2016-11-28 ENCOUNTER — Emergency Department (HOSPITAL_COMMUNITY): Payer: Medicare Other

## 2016-11-28 ENCOUNTER — Inpatient Hospital Stay (HOSPITAL_COMMUNITY)
Admission: EM | Admit: 2016-11-28 | Discharge: 2016-12-05 | DRG: 190 | Disposition: A | Discharge status: Skilled Nursing Facility | Payer: Medicare Other | Attending: Internal Medicine | Admitting: Internal Medicine

## 2016-11-28 ENCOUNTER — Inpatient Hospital Stay (HOSPITAL_COMMUNITY): Payer: Medicare Other

## 2016-11-28 DIAGNOSIS — Z882 Allergy status to sulfonamides status: Secondary | ICD-10-CM

## 2016-11-28 DIAGNOSIS — N179 Acute kidney failure, unspecified: Secondary | ICD-10-CM | POA: Diagnosis present

## 2016-11-28 DIAGNOSIS — J449 Chronic obstructive pulmonary disease, unspecified: Secondary | ICD-10-CM | POA: Diagnosis present

## 2016-11-28 DIAGNOSIS — M1990 Unspecified osteoarthritis, unspecified site: Secondary | ICD-10-CM | POA: Diagnosis present

## 2016-11-28 DIAGNOSIS — E8809 Other disorders of plasma-protein metabolism, not elsewhere classified: Secondary | ICD-10-CM | POA: Diagnosis present

## 2016-11-28 DIAGNOSIS — J441 Chronic obstructive pulmonary disease with (acute) exacerbation: Principal | ICD-10-CM | POA: Diagnosis present

## 2016-11-28 DIAGNOSIS — E782 Mixed hyperlipidemia: Secondary | ICD-10-CM | POA: Diagnosis present

## 2016-11-28 DIAGNOSIS — I739 Peripheral vascular disease, unspecified: Secondary | ICD-10-CM | POA: Diagnosis not present

## 2016-11-28 DIAGNOSIS — G2581 Restless legs syndrome: Secondary | ICD-10-CM | POA: Diagnosis present

## 2016-11-28 DIAGNOSIS — Z955 Presence of coronary angioplasty implant and graft: Secondary | ICD-10-CM | POA: Diagnosis not present

## 2016-11-28 DIAGNOSIS — E118 Type 2 diabetes mellitus with unspecified complications: Secondary | ICD-10-CM | POA: Diagnosis not present

## 2016-11-28 DIAGNOSIS — R0602 Shortness of breath: Secondary | ICD-10-CM

## 2016-11-28 DIAGNOSIS — I69351 Hemiplegia and hemiparesis following cerebral infarction affecting right dominant side: Secondary | ICD-10-CM

## 2016-11-28 DIAGNOSIS — I10 Essential (primary) hypertension: Secondary | ICD-10-CM | POA: Diagnosis present

## 2016-11-28 DIAGNOSIS — R748 Abnormal levels of other serum enzymes: Secondary | ICD-10-CM | POA: Diagnosis present

## 2016-11-28 DIAGNOSIS — J9811 Atelectasis: Secondary | ICD-10-CM | POA: Diagnosis present

## 2016-11-28 DIAGNOSIS — I5032 Chronic diastolic (congestive) heart failure: Secondary | ICD-10-CM | POA: Diagnosis present

## 2016-11-28 DIAGNOSIS — I2510 Atherosclerotic heart disease of native coronary artery without angina pectoris: Secondary | ICD-10-CM | POA: Diagnosis present

## 2016-11-28 DIAGNOSIS — E1151 Type 2 diabetes mellitus with diabetic peripheral angiopathy without gangrene: Secondary | ICD-10-CM | POA: Diagnosis present

## 2016-11-28 DIAGNOSIS — R609 Edema, unspecified: Secondary | ICD-10-CM | POA: Diagnosis not present

## 2016-11-28 DIAGNOSIS — Z905 Acquired absence of kidney: Secondary | ICD-10-CM

## 2016-11-28 DIAGNOSIS — R7989 Other specified abnormal findings of blood chemistry: Secondary | ICD-10-CM

## 2016-11-28 DIAGNOSIS — Z880 Allergy status to penicillin: Secondary | ICD-10-CM | POA: Diagnosis not present

## 2016-11-28 DIAGNOSIS — Z9841 Cataract extraction status, right eye: Secondary | ICD-10-CM

## 2016-11-28 DIAGNOSIS — Z885 Allergy status to narcotic agent status: Secondary | ICD-10-CM

## 2016-11-28 DIAGNOSIS — Z7984 Long term (current) use of oral hypoglycemic drugs: Secondary | ICD-10-CM

## 2016-11-28 DIAGNOSIS — E875 Hyperkalemia: Secondary | ICD-10-CM | POA: Diagnosis not present

## 2016-11-28 DIAGNOSIS — N183 Chronic kidney disease, stage 3 (moderate): Secondary | ICD-10-CM | POA: Diagnosis present

## 2016-11-28 DIAGNOSIS — R1084 Generalized abdominal pain: Secondary | ICD-10-CM

## 2016-11-28 DIAGNOSIS — J9601 Acute respiratory failure with hypoxia: Secondary | ICD-10-CM | POA: Diagnosis present

## 2016-11-28 DIAGNOSIS — M79671 Pain in right foot: Secondary | ICD-10-CM | POA: Diagnosis not present

## 2016-11-28 DIAGNOSIS — I130 Hypertensive heart and chronic kidney disease with heart failure and stage 1 through stage 4 chronic kidney disease, or unspecified chronic kidney disease: Secondary | ICD-10-CM | POA: Diagnosis present

## 2016-11-28 DIAGNOSIS — I998 Other disorder of circulatory system: Secondary | ICD-10-CM | POA: Diagnosis not present

## 2016-11-28 DIAGNOSIS — E119 Type 2 diabetes mellitus without complications: Secondary | ICD-10-CM

## 2016-11-28 DIAGNOSIS — T380X5A Adverse effect of glucocorticoids and synthetic analogues, initial encounter: Secondary | ICD-10-CM | POA: Diagnosis present

## 2016-11-28 DIAGNOSIS — G8929 Other chronic pain: Secondary | ICD-10-CM | POA: Diagnosis present

## 2016-11-28 DIAGNOSIS — Z961 Presence of intraocular lens: Secondary | ICD-10-CM | POA: Diagnosis present

## 2016-11-28 DIAGNOSIS — I509 Heart failure, unspecified: Secondary | ICD-10-CM | POA: Diagnosis not present

## 2016-11-28 DIAGNOSIS — R918 Other nonspecific abnormal finding of lung field: Secondary | ICD-10-CM | POA: Diagnosis not present

## 2016-11-28 DIAGNOSIS — I252 Old myocardial infarction: Secondary | ICD-10-CM

## 2016-11-28 DIAGNOSIS — G4700 Insomnia, unspecified: Secondary | ICD-10-CM | POA: Diagnosis present

## 2016-11-28 DIAGNOSIS — Z9842 Cataract extraction status, left eye: Secondary | ICD-10-CM

## 2016-11-28 DIAGNOSIS — I472 Ventricular tachycardia: Secondary | ICD-10-CM | POA: Diagnosis not present

## 2016-11-28 DIAGNOSIS — D631 Anemia in chronic kidney disease: Secondary | ICD-10-CM | POA: Diagnosis present

## 2016-11-28 DIAGNOSIS — Z79899 Other long term (current) drug therapy: Secondary | ICD-10-CM

## 2016-11-28 DIAGNOSIS — Z91040 Latex allergy status: Secondary | ICD-10-CM

## 2016-11-28 DIAGNOSIS — E1122 Type 2 diabetes mellitus with diabetic chronic kidney disease: Secondary | ICD-10-CM | POA: Diagnosis present

## 2016-11-28 DIAGNOSIS — R109 Unspecified abdominal pain: Secondary | ICD-10-CM | POA: Diagnosis present

## 2016-11-28 DIAGNOSIS — Z87891 Personal history of nicotine dependence: Secondary | ICD-10-CM

## 2016-11-28 DIAGNOSIS — I6930 Unspecified sequelae of cerebral infarction: Secondary | ICD-10-CM

## 2016-11-28 HISTORY — DX: Heart failure, unspecified: I50.9

## 2016-11-28 HISTORY — DX: Restless legs syndrome: G25.81

## 2016-11-28 LAB — BRAIN NATRIURETIC PEPTIDE
B Natriuretic Peptide: 37.3 pg/mL (ref 0.0–100.0)
B Natriuretic Peptide: 51.6 pg/mL (ref 0.0–100.0)

## 2016-11-28 LAB — URINALYSIS, ROUTINE W REFLEX MICROSCOPIC
BACTERIA UA: NONE SEEN
Bilirubin Urine: NEGATIVE
GLUCOSE, UA: NEGATIVE mg/dL
HGB URINE DIPSTICK: NEGATIVE
Ketones, ur: NEGATIVE mg/dL
LEUKOCYTES UA: NEGATIVE
NITRITE: NEGATIVE
PH: 5 (ref 5.0–8.0)
PROTEIN: 30 mg/dL — AB
Specific Gravity, Urine: 1.018 (ref 1.005–1.030)

## 2016-11-28 LAB — COMPREHENSIVE METABOLIC PANEL
ALT: 15 U/L (ref 14–54)
AST: 20 U/L (ref 15–41)
Albumin: 3.1 g/dL — ABNORMAL LOW (ref 3.5–5.0)
Alkaline Phosphatase: 86 U/L (ref 38–126)
Anion gap: 7 (ref 5–15)
BUN: 26 mg/dL — AB (ref 6–20)
CHLORIDE: 103 mmol/L (ref 101–111)
CO2: 26 mmol/L (ref 22–32)
CREATININE: 1.89 mg/dL — AB (ref 0.44–1.00)
Calcium: 9 mg/dL (ref 8.9–10.3)
GFR calc Af Amer: 29 mL/min — ABNORMAL LOW (ref >60)
GFR calc non Af Amer: 25 mL/min — ABNORMAL LOW (ref >60)
Glucose, Bld: 179 mg/dL — ABNORMAL HIGH (ref 65–99)
POTASSIUM: 5.5 mmol/L — AB (ref 3.5–5.1)
SODIUM: 136 mmol/L (ref 135–145)
Total Bilirubin: 0.8 mg/dL (ref 0.3–1.2)
Total Protein: 6.3 g/dL — ABNORMAL LOW (ref 6.5–8.1)

## 2016-11-28 LAB — CBC WITH DIFFERENTIAL/PLATELET
BASOS ABS: 0 10*3/uL (ref 0.0–0.1)
BASOS PCT: 0 %
EOS ABS: 0.1 10*3/uL (ref 0.0–0.7)
EOS PCT: 1 %
HCT: 32 % — ABNORMAL LOW (ref 36.0–46.0)
Hemoglobin: 10.1 g/dL — ABNORMAL LOW (ref 12.0–15.0)
Lymphocytes Relative: 31 %
Lymphs Abs: 2.5 10*3/uL (ref 0.7–4.0)
MCH: 28.6 pg (ref 26.0–34.0)
MCHC: 31.6 g/dL (ref 30.0–36.0)
MCV: 90.7 fL (ref 78.0–100.0)
Monocytes Absolute: 0.5 10*3/uL (ref 0.1–1.0)
Monocytes Relative: 7 %
Neutro Abs: 4.9 10*3/uL (ref 1.7–7.7)
Neutrophils Relative %: 61 %
PLATELETS: 359 10*3/uL (ref 150–400)
RBC: 3.53 MIL/uL — AB (ref 3.87–5.11)
RDW: 13.5 % (ref 11.5–15.5)
WBC: 8.1 10*3/uL (ref 4.0–10.5)

## 2016-11-28 LAB — D-DIMER, QUANTITATIVE: D-Dimer, Quant: 4.7 ug/mL-FEU — ABNORMAL HIGH (ref 0.00–0.50)

## 2016-11-28 LAB — POTASSIUM: Potassium: 5.1 mmol/L (ref 3.5–5.1)

## 2016-11-28 LAB — MRSA PCR SCREENING: MRSA by PCR: NEGATIVE

## 2016-11-28 LAB — LIPASE, BLOOD: LIPASE: 84 U/L — AB (ref 11–51)

## 2016-11-28 LAB — GLUCOSE, CAPILLARY
Glucose-Capillary: 104 mg/dL — ABNORMAL HIGH (ref 65–99)
Glucose-Capillary: 100 mg/dL — ABNORMAL HIGH (ref 65–99)

## 2016-11-28 LAB — I-STAT CG4 LACTIC ACID, ED: Lactic Acid, Venous: 1.43 mmol/L (ref 0.5–1.9)

## 2016-11-28 LAB — I-STAT TROPONIN, ED: Troponin i, poc: 0.02 ng/mL (ref 0.00–0.08)

## 2016-11-28 MED ORDER — FENTANYL CITRATE (PF) 100 MCG/2ML IJ SOLN
25.0000 ug | Freq: Once | INTRAMUSCULAR | Status: AC
  Administered 2016-11-28: 25 ug via INTRAVENOUS
  Filled 2016-11-28: qty 2

## 2016-11-28 MED ORDER — GABAPENTIN 300 MG PO CAPS
900.0000 mg | ORAL_CAPSULE | Freq: Every day | ORAL | Status: DC
  Administered 2016-11-28 – 2016-12-04 (×7): 900 mg via ORAL
  Filled 2016-11-28 (×7): qty 3

## 2016-11-28 MED ORDER — MOMETASONE FURO-FORMOTEROL FUM 200-5 MCG/ACT IN AERO
2.0000 | INHALATION_SPRAY | Freq: Two times a day (BID) | RESPIRATORY_TRACT | Status: DC
  Administered 2016-11-29 – 2016-12-05 (×12): 2 via RESPIRATORY_TRACT
  Filled 2016-11-28 (×2): qty 8.8

## 2016-11-28 MED ORDER — GABAPENTIN 300 MG PO CAPS
300.0000 mg | ORAL_CAPSULE | Freq: Three times a day (TID) | ORAL | Status: DC
  Administered 2016-11-28 – 2016-12-05 (×18): 300 mg via ORAL
  Filled 2016-11-28 (×20): qty 1

## 2016-11-28 MED ORDER — HEPARIN (PORCINE) IN NACL 100-0.45 UNIT/ML-% IJ SOLN
1050.0000 [IU]/h | INTRAMUSCULAR | Status: DC
  Administered 2016-11-28: 1200 [IU]/h via INTRAVENOUS
  Administered 2016-11-29: 1050 [IU]/h via INTRAVENOUS
  Filled 2016-11-28 (×2): qty 250

## 2016-11-28 MED ORDER — UMECLIDINIUM BROMIDE 62.5 MCG/INH IN AEPB
1.0000 | INHALATION_SPRAY | Freq: Every day | RESPIRATORY_TRACT | Status: DC
  Administered 2016-11-29 – 2016-12-05 (×6): 1 via RESPIRATORY_TRACT
  Filled 2016-11-28 (×2): qty 7

## 2016-11-28 MED ORDER — PANTOPRAZOLE SODIUM 40 MG PO TBEC
40.0000 mg | DELAYED_RELEASE_TABLET | Freq: Every day | ORAL | Status: DC
  Administered 2016-11-28 – 2016-12-05 (×8): 40 mg via ORAL
  Filled 2016-11-28 (×8): qty 1

## 2016-11-28 MED ORDER — SODIUM CHLORIDE 0.45 % IV SOLN
INTRAVENOUS | Status: DC
  Administered 2016-11-28: 07:00:00 via INTRAVENOUS

## 2016-11-28 MED ORDER — IOPAMIDOL (ISOVUE-300) INJECTION 61%
INTRAVENOUS | Status: AC
  Filled 2016-11-28: qty 30

## 2016-11-28 MED ORDER — IPRATROPIUM-ALBUTEROL 0.5-2.5 (3) MG/3ML IN SOLN
3.0000 mL | Freq: Once | RESPIRATORY_TRACT | Status: AC
  Administered 2016-11-28: 3 mL via RESPIRATORY_TRACT
  Filled 2016-11-28: qty 3

## 2016-11-28 MED ORDER — CITALOPRAM HYDROBROMIDE 10 MG PO TABS
10.0000 mg | ORAL_TABLET | Freq: Every day | ORAL | Status: DC
  Administered 2016-11-28 – 2016-12-05 (×8): 10 mg via ORAL
  Filled 2016-11-28 (×9): qty 1

## 2016-11-28 MED ORDER — FENTANYL CITRATE (PF) 100 MCG/2ML IJ SOLN
25.0000 ug | INTRAMUSCULAR | Status: AC | PRN
  Administered 2016-11-28 – 2016-11-29 (×3): 25 ug via INTRAVENOUS
  Filled 2016-11-28 (×3): qty 2

## 2016-11-28 MED ORDER — GABAPENTIN 300 MG PO CAPS: 300.0000 mg | ORAL_CAPSULE | Freq: Four times a day (QID) | ORAL | Status: DC

## 2016-11-28 MED ORDER — HEPARIN BOLUS VIA INFUSION
4500.0000 [IU] | Freq: Once | INTRAVENOUS | Status: AC
  Administered 2016-11-28: 4500 [IU] via INTRAVENOUS
  Filled 2016-11-28: qty 4500

## 2016-11-28 MED ORDER — ONDANSETRON 4 MG PO TBDP
4.0000 mg | ORAL_TABLET | Freq: Three times a day (TID) | ORAL | Status: DC | PRN
  Administered 2016-12-01 – 2016-12-03 (×2): 4 mg via ORAL
  Filled 2016-11-28 (×3): qty 1

## 2016-11-28 MED ORDER — TRAMADOL HCL 50 MG PO TABS
50.0000 mg | ORAL_TABLET | Freq: Four times a day (QID) | ORAL | Status: DC | PRN
Start: 2016-11-28 — End: 2016-12-05
  Administered 2016-11-28 – 2016-12-04 (×15): 50 mg via ORAL
  Filled 2016-11-28 (×15): qty 1

## 2016-11-28 MED ORDER — ROPINIROLE HCL 0.5 MG PO TABS
0.5000 mg | ORAL_TABLET | ORAL | Status: DC
  Administered 2016-11-28 – 2016-12-04 (×11): 0.5 mg via ORAL
  Filled 2016-11-28 (×12): qty 1

## 2016-11-28 MED ORDER — HYDRALAZINE HCL 20 MG/ML IJ SOLN: 5.0000 mg | Freq: Once | INTRAMUSCULAR | Status: DC

## 2016-11-28 MED ORDER — ATORVASTATIN CALCIUM 80 MG PO TABS
80.0000 mg | ORAL_TABLET | Freq: Every day | ORAL | Status: DC
  Administered 2016-11-28 – 2016-12-04 (×7): 80 mg via ORAL
  Filled 2016-11-28 (×8): qty 1

## 2016-11-28 MED ORDER — AMLODIPINE BESYLATE 5 MG PO TABS
5.0000 mg | ORAL_TABLET | Freq: Every day | ORAL | Status: DC
  Administered 2016-11-28 – 2016-12-05 (×8): 5 mg via ORAL
  Filled 2016-11-28 (×8): qty 1

## 2016-11-28 MED ORDER — ROPINIROLE HCL 0.5 MG PO TABS
0.2500 mg | ORAL_TABLET | ORAL | Status: DC
  Administered 2016-11-28 – 2016-12-04 (×6): 0.25 mg via ORAL
  Filled 2016-11-28 (×10): qty 1

## 2016-11-28 NOTE — ED Notes (Signed)
[place] at bedside for venous duplex. Transporter came to get pt for nuclear med VQ scan. Pt remains on bipap, nuclear med reports cannot have scan done while on bipap. Will continue to reassess ability to come off, and will let them know.

## 2016-11-28 NOTE — H&P (Addendum)
History and Physical    Tina Dyer GHW:[id number] DOB: 12/03/1940 DOA: 11/28/2016  PCP: Vicenta Aly, FNP   Patient coming from: Home.  I have personally briefly reviewed patient's old medical records in [place]  Chief Complaint: Shortness of breath.  HPI: Tina Dyer is a 76 y.o. female with medical history significant of osteoarthritis, unspecified CHF, chronic leg pain, COPD, CAD with history of MI in 2001 with no intervention, cardiac cath with stent placement in 2002, urolithiasis, previous CVA with right-sided deficit, hyperlipidemia, hypertension, insomnia, pulmonary nodules, type 2 diabetes,S/P RIGHT robotic nephroureterectomy on 11/15/16 by Dr. Tresa Moore who is brought via EMS from [place] home after the staff noticed that she was dyspneic with wheezing rhonchi and rales since the afternoon. In route to the hospital, she received albuterol 5 mg via nebulizer, sublingual nitroglycerin and was placed on CPAP by EMS.  Per patient, she had epigastric abdominal pain yesterday in the afternoon after lunch. She subsequently became nauseous and vomited twice, which did not change in quality and intensity of pain. She denies diarrhea, melena, constipation or hematochezia. She denies dysuria, frequency, hematuria or oliguria. Then later in the evening, the patient developed low dyspnea associated with rhonchi, wheezing or rales. She denies productive cough, chest pain, dizziness, diaphoresis or palpitations. She complains of recent lower extremity edema, particularly on the right side.   ED Course: She was put on BiPAP ventilation. Her initial vital signs were temperature 36.51F, pulse 109, blood pressure 102/59 mmHg, respirations 21 and O2 sat of 100% on BiPAP. She was subsequently weaned to [place] oxygen. She was given fentanyl 25 g IVP 1 dose. Her EKG shows sinus tachycardia with borderline repolarization abnormality, troponin and BNP level are normal. Urinalysis show mild  proteinuria at 30 mg/dL, but was otherwise unremarkable WBC 8.1, hemoglobin 10.1 g/dL and platelets 359. Her lipase was 84. Her sodium was 136, potassium 5.5 (follow-up level 5.1), chloride 103 and bicarbonate 26 mmol/L. BUN was 26, creatinine 1.89 and glucose 179 mg/dL. Her albumin was 3.1 g/dL.  Imaging: Chest radiograph show elevation of right hemidiaphragm with mild right atelectasis. CT scan of the abdomen/pelvis without contrast showed right nephrectomy with surgical changes, multiple pulmonary nodules with suggestion of CT chest follow-up in 3-6 months (the patient is following this with her PCP). Please see images and full radiology reports for further detail.  Review of Systems: As per HPI otherwise 10 point review of systems negative.    Past Medical History:  Diagnosis Date  . Arthritis   . CHF (congestive heart failure) ([place])   . Chronic leg pain    right leg nerve pain  . COPD (chronic obstructive pulmonary disease) ([place])   . Coronary artery disease last cardiologist visit 01 / 2017 approx.  in [place] (pt was living w/ her daughter)   pt hx MI 2001 w/ no intervention/  2002 cardiac cath w/ stenting --- pt currently is monitored by pcp  . Family history of adverse reaction to anesthesia    daughter- had problems with breathing   . History of kidney stones   . History of MI (myocardial infarction) 2001   in setting ishcemic CVA  . History of stroke with current residual effects 2001   left side ischemic stroke w/ right hemiplegia residual  . Hyperlipidemia, mixed   . Hypertension   . Insomnia   . Myocardial infarction ([place]) 2001  . Nephrolithiasis    bilateral non-obstructive per ct 09-21-2016  . Pulmonary nodules  RUL,  RLL,  LLL  per CT 09-20-2015 w/ [facility] ,  currently monitored by pcp  . Right hemiplegia ([place])    2001  residual ischemic stroke   . Right renal mass    renal pelvis  . S/P coronary artery stent placement    2002  unknown details but was done  at [place]  . Stroke [facility]) 2002   affected right arm and leg   . Type 2 diabetes mellitus ([place])    type II   . Wears glasses     Past Surgical History:  Procedure Laterality Date  . CARPAL TUNNEL RELEASE  1981  . CATARACT EXTRACTION W/ INTRAOCULAR LENS  IMPLANT, BILATERAL    . CHOLECYSTECTOMY OPEN  1964  . CORONARY ANGIOPLASTY WITH STENT PLACEMENT  2002   at [facility] per family  . CYSTOSCOPY WITH RETROGRADE PYELOGRAM, URETEROSCOPY AND STENT PLACEMENT Right 10/11/2016   Procedure: CYSTOSCOPY WITH RETROGRADE PYELOGRAM, URETEROSCOPY WITH BIOPSY AND STENT PLACEMENT;  Surgeon: Alexis Frock, MD;  Location: [facility];  Service: Urology;  Laterality: Right;  . ORIF ANKLE FRACTURE Left 1991  . ROBOT ASSITED LAPAROSCOPIC NEPHROURETERECTOMY Right 11/15/2016   Procedure: XI ROBOT ASSITED LAPAROSCOPIC NEPHROURETERECTOMY;  Surgeon: Alexis Frock, MD;  Location: [facility];  Service: Urology;  Laterality: Right;  . UNKNOWN APPROACH HYSTERECTOMY W/ BILATERAL SALPINGOOPHORECTOMY  1982     reports that she quit smoking about 4 years ago. Her smoking use included Cigarettes. She has a 100.00 pack-year smoking history. She has never used smokeless tobacco. She reports that she does not drink alcohol or use drugs.  Allergies  Allergen Reactions  . Sulfa Antibiotics Hives    blisters  . Codeine Hives  . Latex Hives  . Penicillins Hives    Has patient had a PCN reaction causing immediate rash, facial/tongue/throat swelling, SOB or lightheadedness with hypotension: No Has patient had a PCN reaction causing severe rash involving mucus membranes or skin necrosis: No Has patient had a PCN reaction that required hospitalization No Has patient had a PCN reaction occurring within the last 10 years: No If all of the above answers are "NO", then may proceed with Cephalosporin use.      Family History  Problem Relation Age of Onset  . Heart disease Mother   . Diabetes Mellitus II  Mother   . Heart disease Father   . Diabetes Mellitus II Father   . Heart disease Other        Had 20 sibblings and all have had heart issues.    Prior to Admission medications   Medication Sig Start Date End Date Taking? Authorizing Provider  albuterol (VENTOLIN HFA) 108 (90 Base) MCG/ACT inhaler Inhale 2 puffs into the lungs every 4 (four) hours as needed for wheezing or shortness of breath.  08/30/15 10/10/16  [provider]  amLODipine (NORVASC) 5 MG tablet Take 1 tablet by mouth daily. 08/30/15   [provider]  atorvastatin (LIPITOR) 80 MG tablet Take 80 mg by mouth at bedtime.    [provider]  budesonide-formoterol (SYMBICORT) 160-4.5 MCG/ACT inhaler Inhale 2 puffs into the lungs 2 (two) times daily.    [provider]  citalopram (CELEXA) 10 MG tablet Take 10 mg by mouth daily.     [provider]  gabapentin (NEURONTIN) 300 MG capsule Take 300 mg by mouth 4 (four) times daily. Pt takes 300 mg in AM and 900 mg in PM 08/30/15   [provider]  lisinopril (  PRINIVIL,ZESTRIL) 10 MG tablet Take 10 mg by mouth daily.    [provider]  metFORMIN (GLUCOPHAGE) 1000 MG tablet Take 1,000 mg by mouth daily.    [provider]  omeprazole (PRILOSEC) 20 MG capsule Take 20 mg by mouth daily.     [provider]  ondansetron (ZOFRAN ODT) 4 MG disintegrating tablet Take 1 tablet (4 mg total) by mouth every 8 (eight) hours as needed for nausea or vomiting. 09/21/16   Alfonzo Beers, MD  rOPINIRole (REQUIP) 0.25 MG tablet Take 0.5-0.75 mg by mouth 3 (three) times daily. 0.5 mg in the afternoon, and 0.75 at night (one at 1500, two at 1900, two at 2200)    [provider]  traMADol (ULTRAM) 50 MG tablet Take 1-2 tablets (50-100 mg total) by mouth every 6 (six) hours as needed for moderate pain or severe pain. 11/15/16 11/15/17  Debbrah Alar, PA-C  umeclidinium bromide (INCRUSE ELLIPTA) 62.5 MCG/INH AEPB Inhale 1 puff  into the lungs daily.    [provider]    Physical Exam: Vitals:   11/28/16 0315 11/28/16 0346 11/28/16 0400 11/28/16 0424  BP: 129/61  122/65 (!) 152/59  Pulse: 79 84 78 88  Resp: 15 16 14  (!) 22  Temp:      TempSrc:      SpO2: 100% 100% 100% 96%    Constitutional: NAD, calm, comfortable Eyes: PERRL, lids and conjunctivae normal ENMT: Mucous membranes and lips are mildly dry. Posterior pharynx clear of any exudate or lesions. Neck: normal, supple, no masses, no thyromegaly Respiratory: clear to auscultation bilaterally, no wheezing, no crackles. Normal respiratory effort. No accessory muscle use.  Cardiovascular: Regular rate and rhythm, no murmurs / rubs / gallops. No extremity edema. 2+ pedal pulses. No carotid bruits.  Abdomen: Soft, mild diffuse tenderness, no guarding/rebound/masses palpated. No hepatosplenomegaly. Bowel sounds positive.  Musculoskeletal: no clubbing / cyanosis.Good ROM, no contractures. RUE and RLE increased muscle tone. Right hand contracture. Skin: no significant rashes, lesions, ulcers on limited skin exam. Neurologic: CN 2-12 grossly intact. Sensation intact, 3.5/ 5 right sided hemiparesis.  Psychiatric: Normal judgment and insight. Alert and oriented x 3.    Labs on Admission: I have personally reviewed following labs and imaging studies  CBC:  Recent Labs Lab 11/28/16 0055  WBC 8.1  NEUTROABS 4.9  HGB 10.1*  HCT 32.0*  MCV 90.7  PLT 371   Basic Metabolic Panel:  Recent Labs Lab 11/28/16 0055 11/28/16 0343  NA 136  --   K 5.5* 5.1  CL 103  --   CO2 26  --   GLUCOSE 179*  --   BUN 26*  --   CREATININE 1.89*  --   CALCIUM 9.0  --    GFR: Estimated Creatinine Clearance: 25.8 mL/min (A) (by C-G formula based on SCr of 1.89 mg/dL (H)). Liver Function Tests:  Recent Labs Lab 11/28/16 0055  AST 20  ALT 15  ALKPHOS 86  BILITOT 0.8  PROT 6.3*  ALBUMIN 3.1*    Recent Labs Lab 11/28/16 0055  LIPASE 84*   No  results for input(s): AMMONIA in the last 168 hours. Coagulation Profile: No results for input(s): INR, PROTIME in the last 168 hours. Cardiac Enzymes: No results for input(s): CKTOTAL, CKMB, CKMBINDEX, TROPONINI in the last 168 hours. BNP (last 3 results) No results for input(s): PROBNP in the last 8760 hours. HbA1C: No results for input(s): HGBA1C in the last 72 hours. CBG: No results for input(s): GLUCAP in  the last 168 hours. Lipid Profile: No results for input(s): CHOL, HDL, LDLCALC, TRIG, CHOLHDL, LDLDIRECT in the last 72 hours. Thyroid Function Tests: No results for input(s): TSH, T4TOTAL, FREET4, T3FREE, THYROIDAB in the last 72 hours. Anemia Panel: No results for input(s): VITAMINB12, FOLATE, FERRITIN, TIBC, IRON, RETICCTPCT in the last 72 hours. Urine analysis:    Component Value Date/Time   COLORURINE YELLOW 11/28/2016 0335   APPEARANCEUR CLEAR 11/28/2016 0335   LABSPEC 1.018 11/28/2016 0335   PHURINE 5.0 11/28/2016 0335   GLUCOSEU NEGATIVE 11/28/2016 0335   HGBUR NEGATIVE 11/28/2016 0335   BILIRUBINUR NEGATIVE 11/28/2016 0335   KETONESUR NEGATIVE 11/28/2016 0335   PROTEINUR 30 (A) 11/28/2016 0335   NITRITE NEGATIVE 11/28/2016 0335   LEUKOCYTESUR NEGATIVE 11/28/2016 0335    Radiological Exams on Admission: Ct Abdomen Pelvis Wo Contrast  Result Date: 11/28/2016 CLINICAL DATA:  Shortness of breath and abdominal pain EXAM: CT ABDOMEN AND PELVIS WITHOUT CONTRAST TECHNIQUE: Multidetector CT imaging of the abdomen and pelvis was performed following the standard protocol without IV contrast. COMPARISON:  CT abdomen pelvis 09/21/2016 FINDINGS: Examination is degraded by motion. Lower chest: There are multiple nodular opacities of the lung bases, including a 8 mm nodule in the lingula and in unchanged 9 mm nodule in the left lower lobe. Hepatobiliary: Normal hepatic size and contours. No perihepatic ascites. No intra- or extrahepatic biliary dilatation. Gallbladder is surgically  absent. Pancreas: Normal pancreatic contours. No peripancreatic fluid collection or pancreatic ductal dilatation. Spleen: Normal. Adrenals/Urinary Tract: Normal adrenal glands. Status post right nephrectomy. Normal left kidney. Stomach/Bowel: There is no hiatal hernia. The stomach and duodenum are normal. There is no dilated small bowel or enteric inflammation. There is no colonic abnormality. The appendix is normal. Vascular/Lymphatic: There is atherosclerotic calcification of the non aneurysmal abdominal aorta. No abdominal or pelvic adenopathy. Reproductive: Status post hysterectomy.  No adnexal mass. Musculoskeletal: Multilevel lumbar degenerative means without bony spinal canal stenosis. Normal visualized extrathoracic and extraperitoneal soft tissues. Other: There is subcutaneous emphysema within the anterior abdominal wall, compatible with recent abdominal surgery. IMPRESSION: 1. Status post recent right nephrectomy with expected postsurgical findings. 2. No acute abdominal or pelvic abnormality. 3.  Aortic Atherosclerosis (ICD10-I70.0). 4. Multiple basilar pulmonary nodules measuring up to 9 mm. Non-contrast chest CT at 3-6 months is recommended. If the nodules are stable at time of repeat CT, then future CT at 18-24 months (from today's scan) is considered optional for low-risk patients, but is recommended for high-risk patients. This recommendation follows the consensus statement: Guidelines for Management of Incidental Pulmonary Nodules Detected on CT Images: From the Fleischner Society 2017; Radiology 2017; 284:228-243. Electronically Signed   By: Ulyses Jarred M.D.   On: 11/28/2016 04:43   Dg Chest Port 1 View  Result Date: 11/28/2016 CLINICAL DATA:  Acute onset of shortness of breath and wheezing. Initial encounter. EXAM: PORTABLE CHEST 1 VIEW COMPARISON:  Chest radiograph performed 10/18/2015 FINDINGS: There is elevation of the right hemidiaphragm, with mild right basilar atelectasis. No pleural  effusion or pneumothorax is seen The cardiomediastinal silhouette is normal in size. No acute osseous abnormalities are identified. IMPRESSION: Elevation of the right hemidiaphragm, with mild right basilar atelectasis. Electronically Signed   By: Garald Balding M.D.   On: 11/28/2016 01:13    EKG: Independently reviewed. Vent. rate 104 BPM PR interval * ms QRS duration 98 ms QT/QTc 329/433 ms P-R-T axes 84 78 27 Anna's tachycardia Borderline repolarization abnormality  Assessment/Plan Principal Problem:   Acute respiratory failure with  hypoxia ([place])   COPD (chronic obstructive pulmonary disease) ([place]) Admit to SDU/inpatient. Continue  BiPAP ventilation and supplemental oxygen. DuoNeb every 6 hours. Albuterol 2.5 mg nebulizer as needed. Check VQ scan in a.m. to rule out PE.  Active Problems:   AKI (acute kidney injury) ([place]) Possibly due to decreased oral intake, lisinopril therapy and recent right nephroureterectomy on 11/15/2016 Hold lisinopril and hydrate gently. Follow-up renal function and electrolytes.    Type 2 diabetes mellitus ([facility]) Carbohydrate morning CBG monitoring with regular insulin sliding scale.    Hyperlipidemia, mixed Continue atorvastatin 80 mg by mouth at bedtime. LFTs are unremarkable, except for mild hypoalbuminemia.. Follow-up fasting lipids as an outpatient.    Coronary artery disease Continue atorvastatin and      Hyperkalemia Has already resolved. Hold lisinopril for now.    Hypertension Continue amlodipine 5 mg by mouth daily. Hold lisinopril due to increased renal function parameters and earlier hyperkalemia. Monitor blood pressure, renal function and electrolytes.      Abdominal pain Clear liquid diet. Continue analgesics as needed.    Elevated lipase Clear liquids diet. Analgesics as needed. Follow-up lipase level.    Pulmonary nodules The patient is following this with her PCP.    History of stroke with current residual  effects Supportive care. Continue antiplatelet theray.    Anemia Monitor hematocrit and hemoglobin.   DVT prophylaxis: Heparin SQ. Code Status: Full code. Family Communication:  Disposition Plan: Admit for further evaluation and treatment. VQ scan in a.m. Consults called:  Admission status: Observation/telemetry.   Reubin Milan MD [facility] Pager [phone]  If 7PM-7AM, please contact night-coverage [email or web address] Password [email or web address]  11/28/2016, 5:13 AM

## 2016-11-28 NOTE — ED Notes (Signed)
Family at bedside. 

## 2016-11-28 NOTE — ED Notes (Addendum)
Dr. Olevia Bowens ( admitting MD ) notified on pt.'s recurring chest congestion with rales and crackles  / SOB , hypertension and restlessness . RT at bedside reapplying BIPAP to pt. , admitting MD advised RN that he will upgrade pt. to a stepdown unit.

## 2016-11-28 NOTE — ED Triage Notes (Signed)
Patient arrived with EMS from [facility] staff reported worsening SOB with rhonchi/wheezing and rales onset this afternoon , she received Albuterol nebulizer 5 mg and 1 NTG sl by EMS prior to arrival . Pt. placed on a CPAP by EMS prior to arrival.

## 2016-11-28 NOTE — ED Notes (Signed)
Pt. drinking oral contrast , BIPAP continues , IV site intact , respirations unlabored , repositioned on bed for comfort.

## 2016-11-28 NOTE — ED Provider Notes (Signed)
[facility] DEPT Provider Note   CSN: [id number] Arrival date & time: 11/28/16  0102  By signing my name below, I, Collene Leyden, attest that this documentation has been prepared under the direction and in the presence of Pollina, Gwenyth Allegra, MD. Electronically Signed: Collene Leyden, Scribe. 11/28/16. 12:53 AM.  History   Chief Complaint Chief Complaint  Patient presents with  . Shortness of Breath    HPI Comments: Tina Dyer is a 76 y.o. female with a history of congestive heart failure, COPD, CAD with stent placement, hypertension, and an MI, who presents to the Emergency Department complaining of sudden-onset, persistent shortness of breath that began earlier tonight. Patient states she developed shortness of breath earlier tonight. No additional symptoms noted. Patient is on a BiPAP machine currently. Patient does report recent abdominal surgery due to cancer, she is complaining of abdominal pain. Patient was given one nebulizer treatment and one nitroglycerin while en route. Patient denies any chest pain, cough, diaphoresis, nausea, vomiting, diarrhea, or any additional symptoms.   The history is provided by the patient and the EMS personnel. No language interpreter was used.    Past Medical History:  Diagnosis Date  . Arthritis   . CHF (congestive heart failure) ([place])   . Chronic leg pain    right leg nerve pain  . COPD (chronic obstructive pulmonary disease) ([place])   . Coronary artery disease last cardiologist visit 01 / 2017 approx.  in [place] (pt was living w/ her daughter)   pt hx MI 2001 w/ no intervention/  2002 cardiac cath w/ stenting --- pt currently is monitored by pcp  . Family history of adverse reaction to anesthesia    daughter- had problems with breathing   . History of kidney stones   . History of MI (myocardial infarction) 2001   in setting ishcemic CVA  . History of stroke with current residual effects 2001   left side ischemic stroke w/  right hemiplegia residual  . Hyperlipidemia, mixed   . Hypertension   . Insomnia   . Myocardial infarction ([place]) 2001  . Nephrolithiasis    bilateral non-obstructive per ct 09-21-2016  . Pulmonary nodules    RUL,  RLL,  LLL  per CT 09-20-2015 w/ [facility] ,  currently monitored by pcp  . Right hemiplegia ([place])    2001  residual ischemic stroke   . Right renal mass    renal pelvis  . S/P coronary artery stent placement    2002  unknown details but was done at [place]  . Stroke [facility]) 2002   affected right arm and leg   . Type 2 diabetes mellitus ([place])    type II   . Wears glasses     Patient Active Problem List   Diagnosis Date Noted  . Renal mass 11/15/2016    Past Surgical History:  Procedure Laterality Date  . CARPAL TUNNEL RELEASE  1981  . CATARACT EXTRACTION W/ INTRAOCULAR LENS  IMPLANT, BILATERAL    . CHOLECYSTECTOMY OPEN  1964  . CORONARY ANGIOPLASTY WITH STENT PLACEMENT  2002   at [facility] per family  . CYSTOSCOPY WITH RETROGRADE PYELOGRAM, URETEROSCOPY AND STENT PLACEMENT Right 10/11/2016   Procedure: CYSTOSCOPY WITH RETROGRADE PYELOGRAM, URETEROSCOPY WITH BIOPSY AND STENT PLACEMENT;  Surgeon: Alexis Frock, MD;  Location: [facility];  Service: Urology;  Laterality: Right;  . ORIF ANKLE FRACTURE Left 1991  . ROBOT ASSITED LAPAROSCOPIC NEPHROURETERECTOMY Right 11/15/2016   Procedure: XI ROBOT ASSITED LAPAROSCOPIC  NEPHROURETERECTOMY;  Surgeon: Alexis Frock, MD;  Location: [facility];  Service: Urology;  Laterality: Right;  . UNKNOWN APPROACH HYSTERECTOMY W/ BILATERAL SALPINGOOPHORECTOMY  1982    OB History    No data available       Home Medications    Prior to Admission medications   Medication Sig Start Date End Date Taking? Authorizing Provider  albuterol (VENTOLIN HFA) 108 (90 Base) MCG/ACT inhaler Inhale 2 puffs into the lungs every 4 (four) hours as needed for wheezing or shortness of breath.  08/30/15 10/10/16  [provider]    amLODipine (NORVASC) 5 MG tablet Take 1 tablet by mouth daily. 08/30/15   [provider]  atorvastatin (LIPITOR) 80 MG tablet Take 80 mg by mouth at bedtime.    [provider]  budesonide-formoterol (SYMBICORT) 160-4.5 MCG/ACT inhaler Inhale 2 puffs into the lungs 2 (two) times daily.    [provider]  citalopram (CELEXA) 10 MG tablet Take 10 mg by mouth daily.     [provider]  gabapentin (NEURONTIN) 300 MG capsule Take 300 mg by mouth 4 (four) times daily. Pt takes 300 mg in AM and 900 mg in PM 08/30/15   [provider]  lisinopril (PRINIVIL,ZESTRIL) 10 MG tablet Take 10 mg by mouth daily.    [provider]  metFORMIN (GLUCOPHAGE) 1000 MG tablet Take 1,000 mg by mouth daily.    [provider]  omeprazole (PRILOSEC) 20 MG capsule Take 20 mg by mouth daily.     [provider]  ondansetron (ZOFRAN ODT) 4 MG disintegrating tablet Take 1 tablet (4 mg total) by mouth every 8 (eight) hours as needed for nausea or vomiting. 09/21/16   Alfonzo Beers, MD  rOPINIRole (REQUIP) 0.25 MG tablet Take 0.5-0.75 mg by mouth 3 (three) times daily. 0.5 mg in the afternoon, and 0.75 at night (one at 1500, two at 1900, two at 2200)    [provider]  traMADol (ULTRAM) 50 MG tablet Take 1-2 tablets (50-100 mg total) by mouth every 6 (six) hours as needed for moderate pain or severe pain. 11/15/16 11/15/17  Debbrah Alar, PA-C  umeclidinium bromide (INCRUSE ELLIPTA) 62.5 MCG/INH AEPB Inhale 1 puff into the lungs daily.    [provider]    Family History No family history on file.  Social History Social History  Substance Use Topics  . Smoking status: Former Smoker    Packs/day: 2.00    Years: 50.00    Types: Cigarettes    Quit date: 10/03/2012  . Smokeless tobacco: Never Used  . Alcohol use No     Allergies   Sulfa antibiotics; Codeine; Latex; and Penicillins   Review of Systems Review of Systems   Constitutional: Negative for chills and fever.  Respiratory: Positive for shortness of breath. Negative for cough.   Cardiovascular: Negative for chest pain.  Gastrointestinal: Positive for abdominal pain. Negative for diarrhea, nausea and vomiting.  All other systems reviewed and are negative.    Physical Exam Updated Vital Signs BP (!) 152/59 (BP Location: Left Arm)   Pulse 88   Temp 97.6 F (36.4 C) (Axillary)   Resp (!) 22   SpO2 96%   Physical Exam  Constitutional: She is oriented to person, place, and time. She appears well-developed and well-nourished. No distress.  HENT:  Head: Normocephalic and atraumatic.  Right Ear: Hearing normal.  Left Ear: Hearing normal.  Nose: Nose normal.  Mouth/Throat: Oropharynx is clear and moist and mucous membranes are  normal.  Eyes: Conjunctivae and EOM are normal. Pupils are equal, round, and reactive to light.  Neck: Normal range of motion. Neck supple.  Cardiovascular: Regular rhythm, S1 normal and S2 normal.  Exam reveals no gallop and no friction rub.   No murmur heard. Tachycardic.   Pulmonary/Chest: Effort normal. No respiratory distress. She has rales. She exhibits no tenderness.  On BiPAP with scattered rales.   Abdominal: Soft. Normal appearance and bowel sounds are normal. There is no hepatosplenomegaly. There is tenderness. There is no rebound, no guarding, no tenderness at McBurney's point and negative Murphy's sign. No hernia.  Diffuse tenderness.   Musculoskeletal: Normal range of motion.  Neurological: She is alert and oriented to person, place, and time. She has normal strength. No cranial nerve deficit or sensory deficit. Coordination normal. GCS eye subscore is 4. GCS verbal subscore is 5. GCS motor subscore is 6.  Skin: Skin is warm, dry and intact. No rash noted. No cyanosis.  Psychiatric: She has a normal mood and affect. Her speech is normal and behavior is normal. Thought content normal.  Nursing note and vitals  reviewed.    ED Treatments / Results  DIAGNOSTIC STUDIES: Oxygen Saturation is 98% on BiPAP, normal by my interpretation.    COORDINATION OF CARE: 12:52 AM Discussed treatment plan with pt at bedside and pt agreed to plan.   Labs (all labs ordered are listed, but only abnormal results are displayed) Labs Reviewed  CBC WITH DIFFERENTIAL/PLATELET - Abnormal; Notable for the following:       Result Value   RBC 3.53 (*)    Hemoglobin 10.1 (*)    HCT 32.0 (*)    All other components within normal limits  COMPREHENSIVE METABOLIC PANEL - Abnormal; Notable for the following:    Potassium 5.5 (*)    Glucose, Bld 179 (*)    BUN 26 (*)    Creatinine, Ser 1.89 (*)    Total Protein 6.3 (*)    Albumin 3.1 (*)    GFR calc non Af Amer 25 (*)    GFR calc Af Amer 29 (*)    All other components within normal limits  LIPASE, BLOOD - Abnormal; Notable for the following:    Lipase 84 (*)    All other components within normal limits  URINALYSIS, ROUTINE W REFLEX MICROSCOPIC - Abnormal; Notable for the following:    Protein, ur 30 (*)    Squamous Epithelial / LPF 0-5 (*)    All other components within normal limits  BRAIN NATRIURETIC PEPTIDE  POTASSIUM  I-STAT CG4 LACTIC ACID, ED  I-STAT TROPOININ, ED    EKG  EKG Interpretation  Date/Time:  Thursday November 28 2016 00:47:19 EDT Ventricular Rate:  104 PR Interval:    QRS Duration: 98 QT Interval:  329 QTC Calculation: 433 R Axis:   78 Text Interpretation:  Sinus tachycardia Borderline repolarization abnormality Confirmed by Orpah Greek [phone]) on 11/28/2016 1:40:50 AM       Radiology Ct Abdomen Pelvis Wo Contrast  Result Date: 11/28/2016 CLINICAL DATA:  Shortness of breath and abdominal pain EXAM: CT ABDOMEN AND PELVIS WITHOUT CONTRAST TECHNIQUE: Multidetector CT imaging of the abdomen and pelvis was performed following the standard protocol without IV contrast. COMPARISON:  CT abdomen pelvis 09/21/2016 FINDINGS: Examination  is degraded by motion. Lower chest: There are multiple nodular opacities of the lung bases, including a 8 mm nodule in the lingula and in unchanged 9 mm nodule in the left lower lobe. Hepatobiliary:  Normal hepatic size and contours. No perihepatic ascites. No intra- or extrahepatic biliary dilatation. Gallbladder is surgically absent. Pancreas: Normal pancreatic contours. No peripancreatic fluid collection or pancreatic ductal dilatation. Spleen: Normal. Adrenals/Urinary Tract: Normal adrenal glands. Status post right nephrectomy. Normal left kidney. Stomach/Bowel: There is no hiatal hernia. The stomach and duodenum are normal. There is no dilated small bowel or enteric inflammation. There is no colonic abnormality. The appendix is normal. Vascular/Lymphatic: There is atherosclerotic calcification of the non aneurysmal abdominal aorta. No abdominal or pelvic adenopathy. Reproductive: Status post hysterectomy.  No adnexal mass. Musculoskeletal: Multilevel lumbar degenerative means without bony spinal canal stenosis. Normal visualized extrathoracic and extraperitoneal soft tissues. Other: There is subcutaneous emphysema within the anterior abdominal wall, compatible with recent abdominal surgery. IMPRESSION: 1. Status post recent right nephrectomy with expected postsurgical findings. 2. No acute abdominal or pelvic abnormality. 3.  Aortic Atherosclerosis (ICD10-I70.0). 4. Multiple basilar pulmonary nodules measuring up to 9 mm. Non-contrast chest CT at 3-6 months is recommended. If the nodules are stable at time of repeat CT, then future CT at 18-24 months (from today's scan) is considered optional for low-risk patients, but is recommended for high-risk patients. This recommendation follows the consensus statement: Guidelines for Management of Incidental Pulmonary Nodules Detected on CT Images: From the Fleischner Society 2017; Radiology 2017; 284:228-243. Electronically Signed   By: Ulyses Jarred M.D.   On:  11/28/2016 04:43   Dg Chest Port 1 View  Result Date: 11/28/2016 CLINICAL DATA:  Acute onset of shortness of breath and wheezing. Initial encounter. EXAM: PORTABLE CHEST 1 VIEW COMPARISON:  Chest radiograph performed 10/18/2015 FINDINGS: There is elevation of the right hemidiaphragm, with mild right basilar atelectasis. No pleural effusion or pneumothorax is seen The cardiomediastinal silhouette is normal in size. No acute osseous abnormalities are identified. IMPRESSION: Elevation of the right hemidiaphragm, with mild right basilar atelectasis. Electronically Signed   By: Garald Balding M.D.   On: 11/28/2016 01:13    Procedures Procedures (including critical care time)  Medications Ordered in ED Medications  iopamidol (ISOVUE-300) 61 % injection (not administered)  fentaNYL (SUBLIMAZE) injection 25 mcg (25 mcg Intravenous Given 11/28/16 0134)     Initial Impression / Assessment and Plan / ED Course  I have reviewed the triage vital signs and the nursing notes.  Pertinent labs & imaging results that were available during my care of the patient were reviewed by me and considered in my medical decision making (see chart for details).     Patient presents to the emergency room for evaluation of difficulty breathing. [facility]. She underwent right sided nephroureterectomy on June 22. EMS report that she was dyspneic and they thought she had some rales and wheezing during transport. She was given a nebulizer treatment and a nitroglycerin, as they suspected she might be experiencing congestive heart failure. She was placed on CPAP.  Patient was continued on BiPAP initially. Her chest x-ray was clear, no evidence of congestive heart failure. BNP was normal. Troponin negative, EKG does not suggest ischemia or infarct. Patient was weaned off of BiPAP. She is on nasal cannula oxygen with normal oxygenation currently.  Patient complaining of abdominal pain.  She did have diffuse tenderness on examination, no obvious peritonitis present. Patient underwent CT scan. She cannot get IV contrast because of renal insufficiency. She was not able to drink any contrast, but imaging did not show any acute abnormality.  At this point the patient's shortness of breath is  unclear etiology. She appears to be improved without any specific intervention. With recent significant surgery, however, PE is considered. I doubt d-dimer will be helpful because of recent surgery. She cannot receive CT angiography because of her renal insufficiency. Patient should be monitored in the hospital and can receive a VQ scan.  Final Clinical Impressions(s) / ED Diagnoses   Final diagnoses:  Shortness of breath  Generalized abdominal pain    New Prescriptions New Prescriptions   No medications on file   I personally performed the services described in this documentation, which was scribed in my presence. The recorded information has been reviewed and is accurate.     Orpah Greek, MD 11/28/16 0500

## 2016-11-28 NOTE — Progress Notes (Signed)
Patient transported to 41M13 without incidence. 41M RT aware.

## 2016-11-28 NOTE — Progress Notes (Signed)
   11/28/16 0346  Oxygen Therapy/Pulse Ox  O2 Device Nasal Cannula  O2 Therapy Oxygen  O2 Flow Rate (L/min) 4 L/min  SpO2 100 %  Patient is taken off the Bipap and placed on [place] per MD order.

## 2016-11-28 NOTE — ED Notes (Signed)
Pt's daughter, Shauna Hugh called, pt reports okay to talk with her, given update. Pt is resting, reports breathing is better. Given warm blanket. Denies pain. Is a x 4. Awaiting bed assignment, remains on bipap.

## 2016-11-28 NOTE — Progress Notes (Signed)
Pt removed from BiPAP.  Currently resting comfortably with no signs of distress.  RN @ bedside.  Will continue to monitor.

## 2016-11-28 NOTE — Progress Notes (Addendum)
Patient seen and evaluated earlier this a.m. please refer to H&P for details regarding history, assessment, and plan. VQ scan to be obtained once patient is off BiPAP. Respiratory therapy has removed BiPAP at 12:28 PM. Currently resting comfortably per reports. Vital signs are stable  Potassium levels are within normal limits on repeat check.  Plan will be to follow-up with VQ scan results. For now continue plan outlined on H&P  VSS  Will reassess next am  Tina Dyer  Addendum: Pt seen and evaluated  Gen: pt in nad, alert and awake CV: s1 and s2 present, no cyanosis Pulm: off bipap, equal chest rise, no increased wob,

## 2016-11-28 NOTE — Progress Notes (Signed)
[place] for heparin Indication: pulmonary embolus  Heparin Dosing Weight: 67.5 kg   Assessment: 24 yof presenting with SOB, elevated d-dimer. Pharmacy consulted to dose heparin for r/o PE. V/Q scan pending for the AM. Not on anticoagulation PTA. Hg 10.1, plt wnl. No bleed documented.  Goal of Therapy:  Heparin level 0.3-0.7 units/ml Monitor platelets by anticoagulation protocol: Yes   Plan:  Heparin 4500 unit bolus Start heparin at 1200 units/h 6h heparin level Daily heparin level/CBC Monitor s/sx bleeding F/u V/Q scan results   Elicia Lamp, PharmD, BCPS Clinical Pharmacist 11/28/2016 9:17 PM

## 2016-11-28 NOTE — ED Notes (Signed)
RT and Dr. Olevia Bowens paged due to pt.'s SOB , crackles/rales and orthopnea. Pt. repositioned for comfort .

## 2016-11-28 NOTE — ED Notes (Signed)
Patient transported to CT scan . 

## 2016-11-28 NOTE — Progress Notes (Signed)
**  Preliminary report by tech**  Bilateral lower extremity venous duplex completed. There is no evidence of deep or superficial vein thrombosis involving the right and left lower extremities. All visualized vessels appear patent and compressible. There is no evidence of Baker's cysts bilaterally.  11/28/16 10:12 AM Carlos Levering RVT

## 2016-11-28 NOTE — ED Notes (Signed)
Pt resting more comfortably, lying on left side. Tolerating bipap well. Will not give hydralazine now, as BP is 99/48.

## 2016-11-29 ENCOUNTER — Inpatient Hospital Stay (HOSPITAL_COMMUNITY): Payer: Medicare Other

## 2016-11-29 DIAGNOSIS — I509 Heart failure, unspecified: Secondary | ICD-10-CM

## 2016-11-29 LAB — BASIC METABOLIC PANEL
ANION GAP: 7 (ref 5–15)
BUN: 25 mg/dL — ABNORMAL HIGH (ref 6–20)
CALCIUM: 8.9 mg/dL (ref 8.9–10.3)
CO2: 26 mmol/L (ref 22–32)
Chloride: 104 mmol/L (ref 101–111)
Creatinine, Ser: 1.72 mg/dL — ABNORMAL HIGH (ref 0.44–1.00)
GFR, EST AFRICAN AMERICAN: 33 mL/min — AB (ref >60)
GFR, EST NON AFRICAN AMERICAN: 28 mL/min — AB (ref >60)
Glucose, Bld: 88 mg/dL (ref 65–99)
POTASSIUM: 5.5 mmol/L — AB (ref 3.5–5.1)
SODIUM: 137 mmol/L (ref 135–145)

## 2016-11-29 LAB — ECHOCARDIOGRAM COMPLETE
AOASC: 32 cm
Area-P 1/2: 3.24 cm2
CHL CUP MV DEC (S): 232
E decel time: 232 msec
E/e' ratio: 8.32
FS: 20 % — AB (ref 28–44)
HEIGHTINCHES: 65 in
IVS/LV PW RATIO, ED: 1.03
LA diam end sys: 31 mm
LA diam index: 1.78 cm/m2
LA vol A4C: 45.8 ml
LASIZE: 31 mm
LAVOL: 52.1 mL
LAVOLIN: 29.9 mL/m2
LDCA: 3.14 cm2
LV E/e'average: 8.32
LV SIMPSON'S DISK: 56
LV dias vol index: 57 mL/m2
LV sys vol: 44 mL — AB (ref 14–42)
LVDIAVOL: 100 mL (ref 46–106)
LVEEMED: 8.32
LVELAT: 8.16 cm/s
LVOTD: 20 mm
LVSYSVOLIN: 25 mL/m2
Lateral S' vel: 12.6 cm/s
MV pk A vel: 115 m/s
MVPKEVEL: 67.9 m/s
MVSPHT: 68 ms
PW: 10 mm — AB (ref 0.6–1.1)
RV TAPSE: 25.6 mm
Stroke v: 56 ml
TDI e' lateral: 8.16
TDI e' medial: 6.64
WEIGHTICAEL: 2313.95 [oz_av]

## 2016-11-29 LAB — CBC
HEMATOCRIT: 29.9 % — AB (ref 36.0–46.0)
Hemoglobin: 9.5 g/dL — ABNORMAL LOW (ref 12.0–15.0)
MCH: 29 pg (ref 26.0–34.0)
MCHC: 31.8 g/dL (ref 30.0–36.0)
MCV: 91.2 fL (ref 78.0–100.0)
PLATELETS: 330 10*3/uL (ref 150–400)
RBC: 3.28 MIL/uL — ABNORMAL LOW (ref 3.87–5.11)
RDW: 13.4 % (ref 11.5–15.5)
WBC: 6.9 10*3/uL (ref 4.0–10.5)

## 2016-11-29 LAB — LIPASE, BLOOD: Lipase: 29 U/L (ref 11–51)

## 2016-11-29 LAB — HEPARIN LEVEL (UNFRACTIONATED)
Heparin Unfractionated: 0.92 IU/mL — ABNORMAL HIGH (ref 0.30–0.70)
Heparin Unfractionated: 0.7 IU/mL (ref 0.30–0.70)

## 2016-11-29 MED ORDER — ALBUTEROL SULFATE (2.5 MG/3ML) 0.083% IN NEBU: 2.5000 mg | INHALATION_SOLUTION | RESPIRATORY_TRACT | Status: DC | PRN

## 2016-11-29 MED ORDER — PREDNISONE 20 MG PO TABS
50.0000 mg | ORAL_TABLET | Freq: Every day | ORAL | Status: DC
  Administered 2016-11-29 – 2016-12-04 (×5): 50 mg via ORAL
  Filled 2016-11-29: qty 2
  Filled 2016-11-29 (×6): qty 1

## 2016-11-29 MED ORDER — SODIUM POLYSTYRENE SULFONATE 15 GM/60ML PO SUSP
15.0000 g | Freq: Once | ORAL | Status: AC
  Administered 2016-11-29: 15 g via ORAL
  Filled 2016-11-29: qty 60

## 2016-11-29 MED ORDER — LORAZEPAM 2 MG/ML IJ SOLN
1.0000 mg | Freq: Once | INTRAMUSCULAR | Status: AC | PRN
  Administered 2016-11-29: 1 mg via INTRAVENOUS
  Filled 2016-11-29: qty 1

## 2016-11-29 MED ORDER — PREDNISONE 20 MG PO TABS: 50.0000 mg | ORAL_TABLET | Freq: Every day | ORAL | Status: DC

## 2016-11-29 MED ORDER — CHLORHEXIDINE GLUCONATE 0.12 % MT SOLN
15.0000 mL | Freq: Two times a day (BID) | OROMUCOSAL | Status: DC
  Administered 2016-11-29 – 2016-12-05 (×13): 15 mL via OROMUCOSAL
  Filled 2016-11-29 (×13): qty 15

## 2016-11-29 MED ORDER — HEPARIN SODIUM (PORCINE) 5000 UNIT/ML IJ SOLN
5000.0000 [IU] | Freq: Three times a day (TID) | INTRAMUSCULAR | Status: DC
  Administered 2016-11-29 – 2016-12-05 (×14): 5000 [IU] via SUBCUTANEOUS
  Filled 2016-11-29 (×12): qty 1

## 2016-11-29 MED ORDER — ORAL CARE MOUTH RINSE
15.0000 mL | Freq: Two times a day (BID) | OROMUCOSAL | Status: DC
  Administered 2016-11-29 – 2016-12-02 (×8): 15 mL via OROMUCOSAL

## 2016-11-29 NOTE — Progress Notes (Signed)
[place] for heparin Indication: pulmonary embolus  Heparin Dosing Weight: 67.5 kg  Assessment: 72 yof presenting with SOB, elevated d-dimer. Pharmacy consulted to dose heparin for r/o PE. V/Q scan pending for the AM, LE dopplers negative.   Patient not on anticoagulation PTA. Hgb 9.5 and platelets within normal limits. Heparin level supratherapeutic at 0.92. No bleeding per RN.   Goal of Therapy:  Heparin level 0.3-0.7 units/ml Monitor platelets by anticoagulation protocol: Yes   Plan:  Decrease heparin gtt to 1050 units/hr 8hr heparin level Daily heparin level/CBC Monitor s/sx bleeding F/u V/Q scan results  Argie Ramming, PharmD Clinical Pharmacist 11/29/16 4:35 AM

## 2016-11-29 NOTE — Progress Notes (Signed)
PROGRESS NOTE    Tina Dyer  GYI:[id number] DOB: [birth year] DOA: 11/28/2016 PCP: Vicenta Aly, FNP    Brief Narrative:  76 y.o. female with medical history significant of osteoarthritis, unspecified CHF, chronic leg pain, COPD, CAD with history of MI in 2001 with no intervention, cardiac cath with stent placement in 2002, urolithiasis, previous CVA with right-sided deficit, hyperlipidemia, hypertension, insomnia, pulmonary nodules, type 2 diabetes,S/P RIGHT robotic nephroureterectomy on 11/15/16 by Dr. Tresa Moore who is brought via EMS from [facility] home after the staff noticed that she was dyspneic with wheezing rhonchi and rales since the afternoon. Of note patient is currently covered with heparin drip until PE ruled out. If PE ruled out will add doxycycline for COPD exacerbation  Assessment & Plan:   Principal Problem:   Acute respiratory failure with hypoxia (HCC) - BNP was WNL - suspecting COPD exacerbation given that patient had wheezes with increased cough although she denies increased sputum production. Of note patient reports living sedentary life. D-dimer was obtained which was elevated. Vascular ultrasound of lower extremities was negative for DVT. Patient is unable to get CT angiogram secondary to renal function as such VQ scan ordered. Patient could not tolerate yesterday. Will try again today with when necessary Ativan  Hyperkalemia - Will place on Kayexalate - and reassess  Active Problems:   Type 2 diabetes mellitus ([place]) - place on diabetic diet once patient eating - will plan on controlling with diet.    Hyperlipidemia, mixed - Pt on lipitor    Coronary artery disease - no chest pain reported - pt on statin    Pulmonary nodules - as per radiology recommendations patient will need repeat imaging:  Non-contrast chest CT at 3-6 months is recommended    History of stroke with current residual effects - Continue statin and secondary stroke prevention    Hypertension -Continue amlodipine    COPD (chronic obstructive pulmonary disease) ([place]) - We'll place on prednisone today -Continue Dulera, will add when necessary albuterol    Abdominal pain - CT scan of abdomen reported no acute findings in abdomen and pelvis    Elevated lipase - Will reassess - no abdominal pain reported today.    AKI (acute kidney injury) ([place]) - improving    Restless leg syndrome    DVT prophylaxis: heparin gtt Code Status: Full Family Communication: discussed with grand daughter 25 number Disposition Plan: Pending improvement in condition   Consultants:   None   Procedures: Echo pending, VQ scan pending  Antimicrobials: None  Subjective: Patient reports breathing better. Denies any abdominal pain today. Granddaughter updated. Granddaughter is Marine scientist.  Objective: Vitals:   11/29/16 0730 11/29/16 0839 11/29/16 1000 11/29/16 1207  BP: (!) 111/50 131/80 (!) 160/64 (!) 144/83  Pulse:  72 91 97  Resp:  18 (!) 26 (!) 21  Temp:  98.5 F (36.9 C)  98.4 F (36.9 C)  TempSrc:  Axillary  Oral  SpO2:  100% 94% 95%  Weight:      Height:        Intake/Output Summary (Last 24 hours) at 11/29/16 1318 Last data filed at 11/29/16 1227  Gross per 24 hour  Intake           100.83 ml  Output             1450 ml  Net         -1349.17 ml   Filed Weights   11/28/16 1223 11/29/16 0300  Weight: 67.5 kg (148 lb  13 oz) 65.6 kg (144 lb 10 oz)    Examination:  General exam: Appears calm and comfortable , In no acute distress Respiratory system: Patient on BiPAP this morning when I spoke to her, equal chest rise, no wheezes Cardiovascular system: S1 & S2 heard, RRR. No JVD, murmurs, or gallops Gastrointestinal system: Abdomen is nondistended, soft and nontender. No organomegaly or masses felt. Normal bowel sounds heard, no guarding. Central nervous system: Alert and oriented. No focal neurological deficits. Extremities: Symmetric 5 x 5 power. Skin: No  rashes, lesions or ulcers, on limited exam. Psychiatry:  Mood & affect appropriate.   Data Reviewed: I have personally reviewed following labs and imaging studies  CBC:  Recent Labs Lab 11/28/16 0055 11/29/16 0334  WBC 8.1 6.9  NEUTROABS 4.9  --   HGB 10.1* 9.5*  HCT 32.0* 29.9*  MCV 90.7 91.2  PLT 359 967   Basic Metabolic Panel:  Recent Labs Lab 11/28/16 0055 11/28/16 0343 11/29/16 0334  NA 136  --  137  K 5.5* 5.1 5.5*  CL 103  --  104  CO2 26  --  26  GLUCOSE 179*  --  88  BUN 26*  --  25*  CREATININE 1.89*  --  1.72*  CALCIUM 9.0  --  8.9   GFR: Estimated Creatinine Clearance: 25.8 mL/min (A) (by C-G formula based on SCr of 1.72 mg/dL (H)). Liver Function Tests:  Recent Labs Lab 11/28/16 0055  AST 20  ALT 15  ALKPHOS 86  BILITOT 0.8  PROT 6.3*  ALBUMIN 3.1*    Recent Labs Lab 11/28/16 0055  LIPASE 84*   No results for input(s): AMMONIA in the last 168 hours. Coagulation Profile: No results for input(s): INR, PROTIME in the last 168 hours. Cardiac Enzymes: No results for input(s): CKTOTAL, CKMB, CKMBINDEX, TROPONINI in the last 168 hours. BNP (last 3 results) No results for input(s): PROBNP in the last 8760 hours. HbA1C: No results for input(s): HGBA1C in the last 72 hours. CBG:  Recent Labs Lab 11/28/16 1228 11/28/16 1612  GLUCAP 104* 100*   Lipid Profile: No results for input(s): CHOL, HDL, LDLCALC, TRIG, CHOLHDL, LDLDIRECT in the last 72 hours. Thyroid Function Tests: No results for input(s): TSH, T4TOTAL, FREET4, T3FREE, THYROIDAB in the last 72 hours. Anemia Panel: No results for input(s): VITAMINB12, FOLATE, FERRITIN, TIBC, IRON, RETICCTPCT in the last 72 hours. Sepsis Labs:  Recent Labs Lab 11/28/16 0123  LATICACIDVEN 1.43    Recent Results (from the past 240 hour(s))  MRSA PCR Screening     Status: None   Collection Time: 11/28/16  2:26 PM  Result Value Ref Range Status   MRSA by PCR NEGATIVE NEGATIVE Final     Comment:        The GeneXpert MRSA Assay (FDA approved for NASAL specimens only), is one component of a comprehensive MRSA colonization surveillance program. It is not intended to diagnose MRSA infection nor to guide or monitor treatment for MRSA infections.          Radiology Studies: Ct Abdomen Pelvis Wo Contrast  Result Date: 11/28/2016 CLINICAL DATA:  Shortness of breath and abdominal pain EXAM: CT ABDOMEN AND PELVIS WITHOUT CONTRAST TECHNIQUE: Multidetector CT imaging of the abdomen and pelvis was performed following the standard protocol without IV contrast. COMPARISON:  CT abdomen pelvis 09/21/2016 FINDINGS: Examination is degraded by motion. Lower chest: There are multiple nodular opacities of the lung bases, including a 8 mm nodule in the lingula and in unchanged  9 mm nodule in the left lower lobe. Hepatobiliary: Normal hepatic size and contours. No perihepatic ascites. No intra- or extrahepatic biliary dilatation. Gallbladder is surgically absent. Pancreas: Normal pancreatic contours. No peripancreatic fluid collection or pancreatic ductal dilatation. Spleen: Normal. Adrenals/Urinary Tract: Normal adrenal glands. Status post right nephrectomy. Normal left kidney. Stomach/Bowel: There is no hiatal hernia. The stomach and duodenum are normal. There is no dilated small bowel or enteric inflammation. There is no colonic abnormality. The appendix is normal. Vascular/Lymphatic: There is atherosclerotic calcification of the non aneurysmal abdominal aorta. No abdominal or pelvic adenopathy. Reproductive: Status post hysterectomy.  No adnexal mass. Musculoskeletal: Multilevel lumbar degenerative means without bony spinal canal stenosis. Normal visualized extrathoracic and extraperitoneal soft tissues. Other: There is subcutaneous emphysema within the anterior abdominal wall, compatible with recent abdominal surgery. IMPRESSION: 1. Status post recent right nephrectomy with expected postsurgical  findings. 2. No acute abdominal or pelvic abnormality. 3.  Aortic Atherosclerosis (ICD10-I70.0). 4. Multiple basilar pulmonary nodules measuring up to 9 mm. Non-contrast chest CT at 3-6 months is recommended. If the nodules are stable at time of repeat CT, then future CT at 18-24 months (from today's scan) is considered optional for low-risk patients, but is recommended for high-risk patients. This recommendation follows the consensus statement: Guidelines for Management of Incidental Pulmonary Nodules Detected on CT Images: From the Fleischner Society 2017; Radiology 2017; 284:228-243. Electronically Signed   By: Ulyses Jarred M.D.   On: 11/28/2016 04:43   Dg Chest Port 1 View  Result Date: 11/28/2016 CLINICAL DATA:  Acute onset of shortness of breath and wheezing. Initial encounter. EXAM: PORTABLE CHEST 1 VIEW COMPARISON:  Chest radiograph performed 10/18/2015 FINDINGS: There is elevation of the right hemidiaphragm, with mild right basilar atelectasis. No pleural effusion or pneumothorax is seen The cardiomediastinal silhouette is normal in size. No acute osseous abnormalities are identified. IMPRESSION: Elevation of the right hemidiaphragm, with mild right basilar atelectasis. Electronically Signed   By: Garald Balding M.D.   On: 11/28/2016 01:13        Scheduled Meds: . amLODipine  5 mg Oral Daily  . atorvastatin  80 mg Oral QHS  . chlorhexidine  15 mL Mouth Rinse BID  . citalopram  10 mg Oral Daily  . gabapentin  300 mg Oral TID WC  . gabapentin  900 mg Oral QHS  . hydrALAZINE  5 mg Intravenous Once  . mouth rinse  15 mL Mouth Rinse q12n4p  . mometasone-formoterol  2 puff Inhalation BID  . pantoprazole  40 mg Oral Daily  . rOPINIRole  0.25 mg Oral Q24H  . rOPINIRole  0.5 mg Oral 2 times per day  . umeclidinium bromide  1 puff Inhalation Daily   Continuous Infusions: . heparin 1,050 Units/hr (11/29/16 1134)     LOS: 1 day    Time spent: > 35 minutes  Velvet Bathe, MD [facility] Pager [phone]  If 7PM-7AM, please contact night-coverage [email or web address] Password [email or web address] 11/29/2016, 1:18 PM

## 2016-11-29 NOTE — Progress Notes (Signed)
  Echocardiogram 2D Echocardiogram has been performed.  Tina Dyer 11/29/2016, 3:43 PM

## 2016-11-29 NOTE — Progress Notes (Signed)
[facility] for heparin Indication: pulmonary embolus  Heparin Dosing Weight: 67.5 kg  Assessment: 43 yof presenting with SOB, elevated d-dimer.   Anticoag: hept gtt for suspected PE pending VQ scan On hep 1050 /hr with lvl 0.7  Renal: SCr 1.72  Heme/Onc: H&H 9.5/29.9, Plt 330  Goal of Therapy:  Heparin level 0.3-0.7 units/ml Monitor platelets by anticoagulation protocol: Yes   Plan:  Hep gtt 1050 Confirm at 1800 Daily heparin level/CBC Monitor s/sx bleeding F/u V/Q scan results  Levester Fresh, PharmD, BCPS, BCCCP Clinical Pharmacist Clinical phone for 11/29/2016 from 7a-3:30p: [phone] If after 3:30p, please call main pharmacy at: [phone] 11/29/2016 12:25 PM

## 2016-11-30 LAB — COMPREHENSIVE METABOLIC PANEL
ALT: 14 U/L (ref 14–54)
AST: 17 U/L (ref 15–41)
Albumin: 3.1 g/dL — ABNORMAL LOW (ref 3.5–5.0)
Alkaline Phosphatase: 101 U/L (ref 38–126)
Anion gap: 11 (ref 5–15)
BILIRUBIN TOTAL: 0.7 mg/dL (ref 0.3–1.2)
BUN: 38 mg/dL — AB (ref 6–20)
CHLORIDE: 102 mmol/L (ref 101–111)
CO2: 22 mmol/L (ref 22–32)
CREATININE: 1.9 mg/dL — AB (ref 0.44–1.00)
Calcium: 9 mg/dL (ref 8.9–10.3)
GFR, EST AFRICAN AMERICAN: 29 mL/min — AB (ref >60)
GFR, EST NON AFRICAN AMERICAN: 25 mL/min — AB (ref >60)
Glucose, Bld: 307 mg/dL — ABNORMAL HIGH (ref 65–99)
Potassium: 4.7 mmol/L (ref 3.5–5.1)
Sodium: 135 mmol/L (ref 135–145)
TOTAL PROTEIN: 6.3 g/dL — AB (ref 6.5–8.1)

## 2016-11-30 MED ORDER — DOXYCYCLINE HYCLATE 100 MG PO TABS
100.0000 mg | ORAL_TABLET | Freq: Two times a day (BID) | ORAL | Status: DC
  Administered 2016-11-30 – 2016-12-05 (×11): 100 mg via ORAL
  Filled 2016-11-30 (×11): qty 1

## 2016-11-30 NOTE — Progress Notes (Signed)
Patient daughter Diane request to speak with Dr. Wendee Beavers. MD text paged request and called back to speak with family via phone. Family has no additional questions at this time.

## 2016-11-30 NOTE — Progress Notes (Signed)
Patient on room air and tolerating well, sats 96%.

## 2016-11-30 NOTE — Progress Notes (Signed)
O2 via nasal cannula reduced to 1L. Patient tolerating well, sats 94%.

## 2016-11-30 NOTE — Progress Notes (Signed)
PROGRESS NOTE    Tina Dyer  MGQ:[id number] DOB: December 08, 1940 DOA: 11/28/2016 PCP: Vicenta Aly, FNP    Brief Narrative:  76 y.o. female with medical history significant of osteoarthritis, unspecified CHF, chronic leg pain, COPD, CAD with history of MI in 2001 with no intervention, cardiac cath with stent placement in 2002, urolithiasis, previous CVA with right-sided deficit, hyperlipidemia, hypertension, insomnia, pulmonary nodules, type 2 diabetes,S/P RIGHT robotic nephroureterectomy on 11/15/16 by Dr. Tresa Moore who is brought via EMS from [facility] home after the staff noticed that she was dyspneic with wheezing rhonchi and rales since the afternoon. Of note patient is currently covered with heparin drip until PE ruled out. If PE ruled out will add doxycycline for COPD exacerbation  Assessment & Plan:   Principal Problem:   Acute respiratory failure with hypoxia (HCC) - BNP was evaluated and wnl - COPD exacerbation most likely cause, VQ scan was low probability for thromboembolism  - adding doxycycline. Pt still requiring bipap at times and not at baseline breathing status  Hyperkalemia - resolved after kayexalate administration. 4.7 on last check.  Active Problems:   Type 2 diabetes mellitus ([place]) - continue diabetic diet - will plan on controlling with diet.    Hyperlipidemia, mixed - Pt on lipitor    Coronary artery disease - no chest pain reported - pt on statin    Pulmonary nodules - as per radiology recommendations patient will need repeat imaging:  Non-contrast chest CT at 3-6 months is recommended    History of stroke with current residual effects - Continue statin and secondary stroke prevention    Hypertension -Continue amlodipine, stable    COPD (chronic obstructive pulmonary disease) ([place]) - We'll place on prednisone today -Continue Dulera, will add when necessary albuterol    Abdominal pain - CT scan of abdomen reported no acute findings in  abdomen and pelvis    Elevated lipase - normalized on repeat testing. - no abdominal pain reported today.    AKI (acute kidney injury) ([place]) - improving    Restless leg syndrome    DVT prophylaxis: heparin gtt Code Status: Full Family Communication: discussed with grand daughter 26 number and daughter. [place] daughter point of contact Disposition Plan: Pending improvement in condition   Consultants:   None   Procedures: Echo pending, VQ scan pending  Antimicrobials: None  Subjective: Pt has no new complaints, breathing better but not at baseline.  Objective: Vitals:   11/30/16 0700 11/30/16 0740 11/30/16 0800 11/30/16 1212  BP: (!) 151/72  137/73   Pulse: 67 87 89 77  Resp: 16 20 17 16   Temp:   98.8 F (37.1 C)   TempSrc:   Oral   SpO2: 96% 94% 95% 96%  Weight:      Height:        Intake/Output Summary (Last 24 hours) at 11/30/16 1308 Last data filed at 11/30/16 0740  Gross per 24 hour  Intake              240 ml  Output              650 ml  Net             -410 ml   Filed Weights   11/28/16 1223 11/29/16 0300  Weight: 67.5 kg (148 lb 13 oz) 65.6 kg (144 lb 10 oz)    Examination:  General exam: in NAD, alert and awake Respiratory system: sitting up off bipap, no accessory muscle use, equal chest  rise, mild exp wheezes Cardiovascular system: S1 & S2 heard, RRR. No JVD, murmurs, or gallops Gastrointestinal system: Non distended, no guarding, no pain on palpation Central nervous system: Alert and oriented. No focal neurological deficits. Extremities: Symmetric 5 x 5 power. Skin: No rashes, lesions or ulcers, on limited exam. Psychiatry:  Mood & affect appropriate.   Data Reviewed: I have personally reviewed following labs and imaging studies  CBC:  Recent Labs Lab 11/28/16 0055 11/29/16 0334  WBC 8.1 6.9  NEUTROABS 4.9  --   HGB 10.1* 9.5*  HCT 32.0* 29.9*  MCV 90.7 91.2  PLT 359 779   Basic Metabolic Panel:  Recent Labs Lab  11/28/16 0055 11/28/16 0343 11/29/16 0334 11/30/16 0412  NA 136  --  137 135  K 5.5* 5.1 5.5* 4.7  CL 103  --  104 102  CO2 26  --  26 22  GLUCOSE 179*  --  88 307*  BUN 26*  --  25* 38*  CREATININE 1.89*  --  1.72* 1.90*  CALCIUM 9.0  --  8.9 9.0   GFR: Estimated Creatinine Clearance: 23.4 mL/min (A) (by C-G formula based on SCr of 1.9 mg/dL (H)). Liver Function Tests:  Recent Labs Lab 11/28/16 0055 11/30/16 0412  AST 20 17  ALT 15 14  ALKPHOS 86 101  BILITOT 0.8 0.7  PROT 6.3* 6.3*  ALBUMIN 3.1* 3.1*    Recent Labs Lab 11/28/16 0055 11/29/16 1430  LIPASE 84* 29   No results for input(s): AMMONIA in the last 168 hours. Coagulation Profile: No results for input(s): INR, PROTIME in the last 168 hours. Cardiac Enzymes: No results for input(s): CKTOTAL, CKMB, CKMBINDEX, TROPONINI in the last 168 hours. BNP (last 3 results) No results for input(s): PROBNP in the last 8760 hours. HbA1C: No results for input(s): HGBA1C in the last 72 hours. CBG:  Recent Labs Lab 11/28/16 1228 11/28/16 1612  GLUCAP 104* 100*   Lipid Profile: No results for input(s): CHOL, HDL, LDLCALC, TRIG, CHOLHDL, LDLDIRECT in the last 72 hours. Thyroid Function Tests: No results for input(s): TSH, T4TOTAL, FREET4, T3FREE, THYROIDAB in the last 72 hours. Anemia Panel: No results for input(s): VITAMINB12, FOLATE, FERRITIN, TIBC, IRON, RETICCTPCT in the last 72 hours. Sepsis Labs:  Recent Labs Lab 11/28/16 0123  LATICACIDVEN 1.43    Recent Results (from the past 240 hour(s))  MRSA PCR Screening     Status: None   Collection Time: 11/28/16  2:26 PM  Result Value Ref Range Status   MRSA by PCR NEGATIVE NEGATIVE Final    Comment:        The GeneXpert MRSA Assay (FDA approved for NASAL specimens only), is one component of a comprehensive MRSA colonization surveillance program. It is not intended to diagnose MRSA infection nor to guide or monitor treatment for MRSA infections.           Radiology Studies: Dg Chest 2 View  Result Date: 11/29/2016 CLINICAL DATA:  Shortness of breath with cough and wheezing EXAM: CHEST  2 VIEW COMPARISON:  November 28, 2016 FINDINGS: There is stable mild right base atelectasis with elevation of the right hemidiaphragm. There is no edema or consolidation. Heart size and pulmonary vascularity are normal. No adenopathy. There is aortic atherosclerosis. There are no evident bone lesions. IMPRESSION: Mild right base atelectasis. No edema or consolidation. There is aortic atherosclerosis. Aortic Atherosclerosis (ICD10-I70.0). Electronically Signed   By: Lowella Grip III M.D.   On: 11/29/2016 17:25   Nm Pulmonary  Perf And Vent  Result Date: 11/29/2016 CLINICAL DATA:  Respiratory failure and hypoxia. EXAM: NUCLEAR MEDICINE VENTILATION - PERFUSION LUNG SCAN TECHNIQUE: Ventilation images were obtained in multiple projections using inhaled aerosol Tc-69m DTPA. Perfusion images were obtained in multiple projections after intravenous injection of Tc-62m MAA. RADIOPHARMACEUTICALS:  31.3 mCi Technetium-4m DTPA aerosol inhalation and 4.2 mCi Technetium-38m MAA IV COMPARISON:  Current chest radiograph. FINDINGS: Ventilation: There is patchy bilateral lung ventilation similar in both lungs. Perfusion: There are nonsegmental areas of relative decreased profusion in the lungs,. There are no wedge-shaped/segmental areas of decreased profusion to suggest pulmonary thromboembolism. IMPRESSION: No evidence of a pulmonary thromboembolism. Patchy areas of decreased ventilation and lesser degrees of nonsegmental areas of relative decreased profusion are likely on the basis of airways disease. Electronically Signed   By: Lajean Manes M.D.   On: 11/29/2016 17:28        Scheduled Meds: . amLODipine  5 mg Oral Daily  . atorvastatin  80 mg Oral QHS  . chlorhexidine  15 mL Mouth Rinse BID  . citalopram  10 mg Oral Daily  . gabapentin  300 mg Oral TID WC  .  gabapentin  900 mg Oral QHS  . heparin subcutaneous  5,000 Units Subcutaneous Q8H  . hydrALAZINE  5 mg Intravenous Once  . mouth rinse  15 mL Mouth Rinse q12n4p  . mometasone-formoterol  2 puff Inhalation BID  . pantoprazole  40 mg Oral Daily  . predniSONE  50 mg Oral Q breakfast  . rOPINIRole  0.25 mg Oral Q24H  . rOPINIRole  0.5 mg Oral 2 times per day  . umeclidinium bromide  1 puff Inhalation Daily   Continuous Infusions:    LOS: 2 days    Time spent: 36 minutes  Velvet Bathe, MD [facility] Pager [phone]  If 7PM-7AM, please contact night-coverage [email or web address] Password [email or web address] 11/30/2016, 1:08 PM

## 2016-12-01 LAB — GLUCOSE, CAPILLARY
GLUCOSE-CAPILLARY: 292 mg/dL — AB (ref 65–99)
GLUCOSE-CAPILLARY: 335 mg/dL — AB (ref 65–99)
GLUCOSE-CAPILLARY: 272 mg/dL — AB (ref 65–99)
Glucose-Capillary: 214 mg/dL — ABNORMAL HIGH (ref 65–99)

## 2016-12-01 LAB — BASIC METABOLIC PANEL
ANION GAP: 9 (ref 5–15)
BUN: 41 mg/dL — AB (ref 6–20)
CHLORIDE: 103 mmol/L (ref 101–111)
CO2: 25 mmol/L (ref 22–32)
Calcium: 9.3 mg/dL (ref 8.9–10.3)
Creatinine, Ser: 1.69 mg/dL — ABNORMAL HIGH (ref 0.44–1.00)
GFR calc Af Amer: 33 mL/min — ABNORMAL LOW (ref >60)
GFR, EST NON AFRICAN AMERICAN: 29 mL/min — AB (ref >60)
GLUCOSE: 231 mg/dL — AB (ref 65–99)
POTASSIUM: 4.5 mmol/L (ref 3.5–5.1)
Sodium: 137 mmol/L (ref 135–145)

## 2016-12-01 LAB — PHOSPHORUS: PHOSPHORUS: 3 mg/dL (ref 2.5–4.6)

## 2016-12-01 LAB — MAGNESIUM: Magnesium: 1.8 mg/dL (ref 1.7–2.4)

## 2016-12-01 MED ORDER — INSULIN ASPART 100 UNIT/ML ~~LOC~~ SOLN
0.0000 [IU] | Freq: Three times a day (TID) | SUBCUTANEOUS | Status: DC
  Administered 2016-12-01: 5 [IU] via SUBCUTANEOUS
  Administered 2016-12-01 – 2016-12-02 (×2): 7 [IU] via SUBCUTANEOUS
  Administered 2016-12-02: 2 [IU] via SUBCUTANEOUS
  Administered 2016-12-02: 5 [IU] via SUBCUTANEOUS
  Administered 2016-12-03: 2 [IU] via SUBCUTANEOUS
  Administered 2016-12-04 (×2): 5 [IU] via SUBCUTANEOUS
  Administered 2016-12-05 (×2): 2 [IU] via SUBCUTANEOUS

## 2016-12-01 NOTE — Progress Notes (Signed)
PROGRESS NOTE    Tina Dyer  XBM:[id number] DOB: 1941-04-18 DOA: 11/28/2016 PCP: Vicenta Aly, FNP    Brief Narrative:  76 y.o. female with medical history significant of osteoarthritis, unspecified CHF, chronic leg pain, COPD, CAD with history of MI in 2001 with no intervention, cardiac cath with stent placement in 2002, urolithiasis, previous CVA with right-sided deficit, hyperlipidemia, hypertension, insomnia, pulmonary nodules, type 2 diabetes,S/P RIGHT robotic nephroureterectomy on 11/15/16 by Dr. Tresa Moore who is brought via EMS from [place] home after the staff noticed that she was dyspneic with wheezing rhonchi and rales since the afternoon. Of note patient is currently covered with heparin drip until PE ruled out. If PE ruled out will add doxycycline for COPD exacerbation  Assessment & Plan:   Principal Problem: Non sustained VTACH - for 6 beats. Echocardiogram obtained and EF at 55 percent. Discussed over phone with cardiology who recommended obtaining metabolites (mag, phos, K) ordered. Unable to use B blocker due to bradycardia and copd exacerbation.   Bradycardia - mild, Blood pressures stable    Acute respiratory failure with hypoxia (HCC)/Acute COPD exacerbation - BNP was evaluated and wnl - COPD exacerbation exacerbation most likely cause given negative work up for PE and normal BNP -  VQ scan was low probability for thromboembolism  - added doxycycline  Hyperkalemia - resolved after kayexalate administration. 4.7 on last check. - reassess today  Active Problems:   Type 2 diabetes mellitus ([place]) - initially controlled with diet but started prednisone. As such will place order for SSI sensitive scale.    Hyperlipidemia, mixed - Pt on lipitor    Coronary artery disease - no chest pain reported - pt on statin    Pulmonary nodules - as per radiology recommendations patient will need repeat imaging:  Non-contrast chest CT at 3-6 months is  recommended    History of stroke with current residual effects - Statin and secondary stroke prevention    Hypertension -Continue amlodipine, stable    COPD (chronic obstructive pulmonary disease) ([place]) - Continue prednisone -Continue Dulera, will add when necessary albuterol    Abdominal pain - CT scan of abdomen reported no acute findings in abdomen and pelvis    Elevated lipase - normalized on last check    AKI (acute kidney injury) ([place]) - stable, will reassess S creatinine today.    Restless leg syndrome   DVT prophylaxis: heparin Code Status: Full Family Communication: discussed with grand daughter 15 number  Disposition Plan: Monitoring on telemetry for Vtach (had non sustained this am) transfer to floor given that patient is not using Bipap anymore. Possible d/c next am.   Consultants:   None   Procedures: Echo reporting EF of 55 % with grade 1 DD, VQ scan negative  Antimicrobials: None  Subjective: Pt feels better. Denies any new problems.  Objective: Vitals:   11/30/16 2309 12/01/16 0300 12/01/16 0727 12/01/16 0755  BP: (!) 152/85 (!) 128/101 (!) 119/54   Pulse: 67 63 (!) 59   Resp: 14 13 13    Temp: 98 F (36.7 C) 98.6 F (37 C) 97.8 F (36.6 C)   TempSrc: Oral Oral Oral   SpO2: 90% 93% 94% 94%  Weight:      Height:        Intake/Output Summary (Last 24 hours) at 12/01/16 0851 Last data filed at 12/01/16 0840  Gross per 24 hour  Intake                0 ml  Output             1100 ml  Net            -1100 ml   Filed Weights   11/28/16 1223 11/29/16 0300  Weight: 67.5 kg (148 lb 13 oz) 65.6 kg (144 lb 10 oz)    Examination:  General exam: pt is awake and alert and in nad. Respiratory system: no wheezes, equal chest rise, no accessory muscle use on RA Cardiovascular system: S1 & S2 heard, RRR. No JVD, murmurs, or gallops Gastrointestinal system: Non distended, no guarding, no pain on palpation Central nervous system: Alert and  oriented. No focal neurological deficits. Extremities: Symmetric 5 x 5 power. Skin: No rashes, lesions or ulcers Psychiatry:  Mood & affect appropriate.   Data Reviewed: I have personally reviewed following labs and imaging studies  CBC:  Recent Labs Lab 11/28/16 0055 11/29/16 0334  WBC 8.1 6.9  NEUTROABS 4.9  --   HGB 10.1* 9.5*  HCT 32.0* 29.9*  MCV 90.7 91.2  PLT 359 854   Basic Metabolic Panel:  Recent Labs Lab 11/28/16 0055 11/28/16 0343 11/29/16 0334 11/30/16 0412  NA 136  --  137 135  K 5.5* 5.1 5.5* 4.7  CL 103  --  104 102  CO2 26  --  26 22  GLUCOSE 179*  --  88 307*  BUN 26*  --  25* 38*  CREATININE 1.89*  --  1.72* 1.90*  CALCIUM 9.0  --  8.9 9.0   GFR: Estimated Creatinine Clearance: 23.4 mL/min (A) (by C-G formula based on SCr of 1.9 mg/dL (H)). Liver Function Tests:  Recent Labs Lab 11/28/16 0055 11/30/16 0412  AST 20 17  ALT 15 14  ALKPHOS 86 101  BILITOT 0.8 0.7  PROT 6.3* 6.3*  ALBUMIN 3.1* 3.1*    Recent Labs Lab 11/28/16 0055 11/29/16 1430  LIPASE 84* 29   No results for input(s): AMMONIA in the last 168 hours. Coagulation Profile: No results for input(s): INR, PROTIME in the last 168 hours. Cardiac Enzymes: No results for input(s): CKTOTAL, CKMB, CKMBINDEX, TROPONINI in the last 168 hours. BNP (last 3 results) No results for input(s): PROBNP in the last 8760 hours. HbA1C: No results for input(s): HGBA1C in the last 72 hours. CBG:  Recent Labs Lab 11/28/16 1228 11/28/16 1612  GLUCAP 104* 100*   Lipid Profile: No results for input(s): CHOL, HDL, LDLCALC, TRIG, CHOLHDL, LDLDIRECT in the last 72 hours. Thyroid Function Tests: No results for input(s): TSH, T4TOTAL, FREET4, T3FREE, THYROIDAB in the last 72 hours. Anemia Panel: No results for input(s): VITAMINB12, FOLATE, FERRITIN, TIBC, IRON, RETICCTPCT in the last 72 hours. Sepsis Labs:  Recent Labs Lab 11/28/16 0123  LATICACIDVEN 1.43    Recent Results (from  the past 240 hour(s))  MRSA PCR Screening     Status: None   Collection Time: 11/28/16  2:26 PM  Result Value Ref Range Status   MRSA by PCR NEGATIVE NEGATIVE Final    Comment:        The GeneXpert MRSA Assay (FDA approved for NASAL specimens only), is one component of a comprehensive MRSA colonization surveillance program. It is not intended to diagnose MRSA infection nor to guide or monitor treatment for MRSA infections.          Radiology Studies: Dg Chest 2 View  Result Date: 11/29/2016 CLINICAL DATA:  Shortness of breath with cough and wheezing EXAM: CHEST  2 VIEW COMPARISON:  November 28, 2016 FINDINGS: There is stable mild right base atelectasis with elevation of the right hemidiaphragm. There is no edema or consolidation. Heart size and pulmonary vascularity are normal. No adenopathy. There is aortic atherosclerosis. There are no evident bone lesions. IMPRESSION: Mild right base atelectasis. No edema or consolidation. There is aortic atherosclerosis. Aortic Atherosclerosis (ICD10-I70.0). Electronically Signed   By: Lowella Grip III M.D.   On: 11/29/2016 17:25   Nm Pulmonary Perf And Vent  Result Date: 11/29/2016 CLINICAL DATA:  Respiratory failure and hypoxia. EXAM: NUCLEAR MEDICINE VENTILATION - PERFUSION LUNG SCAN TECHNIQUE: Ventilation images were obtained in multiple projections using inhaled aerosol Tc-70m DTPA. Perfusion images were obtained in multiple projections after intravenous injection of Tc-29m MAA. RADIOPHARMACEUTICALS:  31.3 mCi Technetium-33m DTPA aerosol inhalation and 4.2 mCi Technetium-28m MAA IV COMPARISON:  Current chest radiograph. FINDINGS: Ventilation: There is patchy bilateral lung ventilation similar in both lungs. Perfusion: There are nonsegmental areas of relative decreased profusion in the lungs,. There are no wedge-shaped/segmental areas of decreased profusion to suggest pulmonary thromboembolism. IMPRESSION: No evidence of a pulmonary  thromboembolism. Patchy areas of decreased ventilation and lesser degrees of nonsegmental areas of relative decreased profusion are likely on the basis of airways disease. Electronically Signed   By: Lajean Manes M.D.   On: 11/29/2016 17:28        Scheduled Meds: . amLODipine  5 mg Oral Daily  . atorvastatin  80 mg Oral QHS  . chlorhexidine  15 mL Mouth Rinse BID  . citalopram  10 mg Oral Daily  . doxycycline  100 mg Oral Q12H  . gabapentin  300 mg Oral TID WC  . gabapentin  900 mg Oral QHS  . heparin subcutaneous  5,000 Units Subcutaneous Q8H  . hydrALAZINE  5 mg Intravenous Once  . mouth rinse  15 mL Mouth Rinse q12n4p  . mometasone-formoterol  2 puff Inhalation BID  . pantoprazole  40 mg Oral Daily  . predniSONE  50 mg Oral Q breakfast  . rOPINIRole  0.25 mg Oral Q24H  . rOPINIRole  0.5 mg Oral 2 times per day  . umeclidinium bromide  1 puff Inhalation Daily   Continuous Infusions:    LOS: 3 days    Time spent: 40 minutes  Velvet Bathe, MD [facility] Pager [phone]  If 7PM-7AM, please contact night-coverage [email or web address] Password [email or web address] 12/01/2016, 8:51 AM

## 2016-12-01 NOTE — Progress Notes (Signed)
Found patient drinking regular coke, states someone from home brought her the drink. Advised patient not to take more concentrated drinks - verbalized understanding.

## 2016-12-02 ENCOUNTER — Inpatient Hospital Stay (HOSPITAL_COMMUNITY): Payer: Medicare Other

## 2016-12-02 DIAGNOSIS — I509 Heart failure, unspecified: Secondary | ICD-10-CM

## 2016-12-02 DIAGNOSIS — I739 Peripheral vascular disease, unspecified: Secondary | ICD-10-CM

## 2016-12-02 DIAGNOSIS — J449 Chronic obstructive pulmonary disease, unspecified: Secondary | ICD-10-CM

## 2016-12-02 DIAGNOSIS — M79671 Pain in right foot: Secondary | ICD-10-CM

## 2016-12-02 LAB — GLUCOSE, CAPILLARY
GLUCOSE-CAPILLARY: 286 mg/dL — AB (ref 65–99)
GLUCOSE-CAPILLARY: 180 mg/dL — AB (ref 65–99)
Glucose-Capillary: 159 mg/dL — ABNORMAL HIGH (ref 65–99)
Glucose-Capillary: 311 mg/dL — ABNORMAL HIGH (ref 65–99)

## 2016-12-02 LAB — URIC ACID: Uric Acid, Serum: 5.1 mg/dL (ref 2.3–6.6)

## 2016-12-02 NOTE — Progress Notes (Signed)
Inpatient Diabetes Program Recommendations  AACE/ADA: New Consensus Statement on Inpatient Glycemic Control (2015)  Target Ranges:  Prepandial:   less than 140 mg/dL      Peak postprandial:   less than 180 mg/dL (1-2 hours)      Critically ill patients:  140 - 180 mg/dL   Lab Results  Component Value Date   GLUCAP 286 (H) 12/02/2016   HGBA1C 7.1 (H) 11/12/2016    Review of Glycemic Control  Results for TELEAH, VILLAMAR (MRN [id number]) as of 12/02/2016 13:13  Ref. Range 12/01/2016 13:40 12/01/2016 17:04 12/01/2016 21:20 12/02/2016 07:33 12/02/2016 11:58  Glucose-Capillary Latest Ref Range: 65 - 99 mg/dL 335 (H) 272 (H) 214 (H) 159 (H) 286 (H)    Diabetes history: Type 2 Outpatient Diabetes medications: Metformin 1000mg  qam Current orders for Inpatient glycemic control: Novolog 0-9 units tid   *steroids 50mg  qam  Inpatient Diabetes Program Recommendations:  Post prandial blood sugars remain elevated despite current Novolog correction.  Consider adding Novolog 2 units tid with meals while the patient is on steroids.   Gentry Fitz, RN, BA, MHA, CDE Diabetes Coordinator Inpatient Diabetes Program  [phone] (Team Pager) [phone] ([place]) 12/02/2016 1:15 PM

## 2016-12-02 NOTE — NC FL2 (Signed)
[place] LEVEL OF CARE SCREENING TOOL     IDENTIFICATION  Patient Name: Tina Dyer Birthdate: April 25, 1941 Sex: female Admission Date (Current Location): 11/28/2016  [facility] and [place] Number:  Herbalist and Address:  [facility], [place] [address] [place], [place] [address]      Provider Number: [id number]  Attending Physician Name and Address:  Velvet Bathe, MD  Relative Name and Phone Number:  Modena Jansky, daughter.  Pnone number [phone]    Current Level of Care: Hospital Recommended Level of Care: [place] Prior Approval Number:    Date Approved/Denied:   PASRR Number: [id number]  Discharge Plan: SNF    Current Diagnoses: Patient Active Problem List   Diagnosis Date Noted  . Type 2 diabetes mellitus ([place]) 11/28/2016  . Acute respiratory failure with hypoxia ([place]) 11/28/2016  . Hyperlipidemia, mixed 11/28/2016  . Coronary artery disease 11/28/2016  . Pulmonary nodules 11/28/2016  . Hypertension 11/28/2016  . COPD (chronic obstructive pulmonary disease) ([place]) 11/28/2016  . Abdominal pain 11/28/2016  . Elevated lipase 11/28/2016  . Hyperkalemia 11/28/2016  . AKI (acute kidney injury) ([place]) 11/28/2016  . Restless leg syndrome 11/28/2016  . Renal mass 11/15/2016  . History of stroke with current residual effects 05/28/1999    Orientation RESPIRATION BLADDER Height & Weight     Self, Time, Situation, Place  Normal Continent Weight: 149 lb 4 oz (67.7 kg) Height:  5\' 5"  (165.1 cm)  BEHAVIORAL SYMPTOMS/MOOD NEUROLOGICAL BOWEL NUTRITION STATUS      Continent Diet (Carb modified)  AMBULATORY STATUS COMMUNICATION OF NEEDS Skin   Limited Assist Verbally                         Personal Care Assistance Level of Assistance  Bathing, Feeding, Dressing Bathing Assistance: Limited assistance Feeding assistance: Independent Dressing Assistance: Limited assistance     Functional  Limitations Info  Sight, Hearing, Speech Sight Info: Impaired (Patient wears glasses) Hearing Info: Adequate Speech Info: Adequate    SPECIAL CARE FACTORS FREQUENCY                       Contractures Contractures Info: Not present    Additional Factors Info  Code Status, Allergies, Insulin Sliding Scale Code Status Info: Full Allergies Info: Sulf antibiotics, Codeine, Latax, Penicillins   Insulin Sliding Scale Info: 0-9 Units, 3 times per day with meals       Current Medications (12/02/2016):  This is the current hospital active medication list Current Facility-Administered Medications  Medication Dose Route Frequency Provider Last Rate Last Dose  . albuterol (PROVENTIL) (2.5 MG/3ML) 0.083% nebulizer solution 2.5 mg  2.5 mg Nebulization Q4H PRN Velvet Bathe, MD      . amLODipine (NORVASC) tablet 5 mg  5 mg Oral Daily Reubin Milan, MD   5 mg at 12/02/16 1035  . atorvastatin (LIPITOR) tablet 80 mg  80 mg Oral QHS Reubin Milan, MD   80 mg at 12/01/16 2253  . chlorhexidine (PERIDEX) 0.12 % solution 15 mL  15 mL Mouth Rinse BID Velvet Bathe, MD   15 mL at 12/02/16 1036  . citalopram (CELEXA) tablet 10 mg  10 mg Oral Daily Reubin Milan, MD   10 mg at 12/02/16 1035  . doxycycline (VIBRA-TABS) tablet 100 mg  100 mg Oral Q12H Velvet Bathe, MD   100 mg at 12/02/16 1035  . gabapentin (NEURONTIN) capsule  300 mg  300 mg Oral TID WC Velvet Bathe, MD   300 mg at 12/02/16 1035  . gabapentin (NEURONTIN) capsule 900 mg  900 mg Oral QHS Velvet Bathe, MD   900 mg at 12/01/16 2253  . heparin injection 5,000 Units  5,000 Units Subcutaneous Q8H Tyrone Apple, RPH   5,000 Units at 12/02/16 2876  . hydrALAZINE (APRESOLINE) injection 5 mg  5 mg Intravenous Once Reubin Milan, MD      . insulin aspart (novoLOG) injection 0-9 Units  0-9 Units Subcutaneous TID [facility] Velvet Bathe, MD   2 Units at 12/02/16 [phone]  . MEDLINE mouth rinse  15 mL Mouth Rinse q12n4p Velvet Bathe, MD    15 mL at 12/01/16 1623  . mometasone-formoterol (DULERA) 200-5 MCG/ACT inhaler 2 puff  2 puff Inhalation BID Reubin Milan, MD   2 puff at 12/01/16 2011  . ondansetron (ZOFRAN-ODT) disintegrating tablet 4 mg  4 mg Oral Q8H PRN Reubin Milan, MD   4 mg at 12/01/16 0900  . pantoprazole (PROTONIX) EC tablet 40 mg  40 mg Oral Daily Reubin Milan, MD   40 mg at 12/02/16 1036  . predniSONE (DELTASONE) tablet 50 mg  50 mg Oral Q breakfast Velvet Bathe, MD   50 mg at 12/02/16 7262  . rOPINIRole (REQUIP) tablet 0.25 mg  0.25 mg Oral Q24H Reubin Milan, MD   0.25 mg at 12/01/16 1647  . rOPINIRole (REQUIP) tablet 0.5 mg  0.5 mg Oral 2 times per day Velvet Bathe, MD   0.5 mg at 12/01/16 2253  . traMADol (ULTRAM) tablet 50 mg  50 mg Oral Q6H PRN Reubin Milan, MD   50 mg at 12/02/16 0355  . umeclidinium bromide (INCRUSE ELLIPTA) 62.5 MCG/INH 1 puff  1 puff Inhalation Daily Reubin Milan, MD   1 puff at 12/01/16 9741     Discharge Medications: Please see discharge summary for a list of discharge medications.  Relevant Imaging Results:  Relevant Lab Results:   Additional Information [id number].  Sable Feil, LCSW

## 2016-12-02 NOTE — Progress Notes (Signed)
VASCULAR LAB PRELIMINARY  ARTERIAL  ABI completed:    RIGHT    LEFT    PRESSURE WAVEFORM  PRESSURE WAVEFORM  BRACHIAL  Unable to obtain due to arm's contracted state BRACHIAL 159 Triphasic  DP 70 Dampened monophasic DP 139 Monophasic  AT   AT    PT 65 Dampened monophasic PT 134 Dampened monophasic  PER   PER    GREAT TOE  NA GREAT TOE  NA    RIGHT LEFT  ABI 0.44 0.87    The right ABI is suggestive of severe arterial insufficiency at rest. The left ABI is suggestive of mild arterial insufficiency at rest. Preliminary results discussed with Dr. Wendee Beavers.  12/02/2016 5:20 PM Maudry Mayhew, BS, RVT, RDCS, RDMS

## 2016-12-02 NOTE — Progress Notes (Addendum)
PROGRESS NOTE    Tina Dyer  FTD:[id number] DOB: Oct 25, 1940 DOA: 11/28/2016 PCP: Vicenta Aly, FNP    Brief Narrative:  76 y.o. female with medical history significant of osteoarthritis, unspecified CHF, chronic leg pain, COPD, CAD with history of MI in 2001 with no intervention, cardiac cath with stent placement in 2002, urolithiasis, previous CVA with right-sided deficit, hyperlipidemia, hypertension, insomnia, pulmonary nodules, type 2 diabetes,S/P RIGHT robotic nephroureterectomy on 11/15/16 by Dr. Tresa Moore who is brought via EMS from [facility] after the staff noticed that she was dyspneic with wheezing rhonchi and rales since the afternoon. Of note patient is currently covered with heparin drip until PE ruled out. PE ruled out and heparin was discontinued.  Patient complaining of right foot pain she states that putting any pressure on is uncomfortable.  Assessment & Plan:   Principal Problem: Non sustained VTACH - for 6 beats. Has resolved and metabolites within normal limits. Most likely precipitated secondary to COPD exacerbation. Patient has been more than 24 hours without any episodes. An echocardiogram reporting EF of 55 percent. We'll discontinue cardiac monitoring given resolution. Of note patient has had periods of bradycardia such we are unable to use beta blocker secondary to bradycardia and COPD.  Right foot discomfort - obtain uric acid levels - obtain ankle brachial index - No bony abnormalities on physical exam or history of trauma to warrant x-ray - Etiology uncertain, continue supportive therapy patient has tramadol on board for pain control  Bradycardia - mild, Blood pressures stable. Patient asymptomatic    Acute respiratory failure with hypoxia (HCC)/Acute COPD exacerbation - BNP was within normal limits - COPD exacerbation exacerbation most likely cause given negative work up for PE and normal BNP - I obtained VQ scan which reported low  probability for thromboembolism  - Continue doxycycline and will plan for seven-day treatment regimen  Hyperkalemia - Resolved and within normal limits on last check  Active Problems:   Type 2 diabetes mellitus ([place]) - initially controlled with diet but started prednisone. As such will place order for SSI sensitive scale.    Hyperlipidemia, mixed - Stable on on lipitor. No chest pain reported    Coronary artery disease - no chest pain reported - pt on statin    Pulmonary nodules - as per radiology recommendations patient will need repeat imaging:  Non-contrast chest CT at 3-6 months is recommended    History of stroke with current residual effects - Statin and secondary stroke prevention    Hypertension -We'll controlled on amlodipine will continue.    COPD (chronic obstructive pulmonary disease) ([place]) - Patient started on prednisone and doxycycline improving on this regimen. Has not required BiPAP -Continue Dulera, will add when necessary albuterol    Abdominal pain - CT scan of abdomen reported no acute findings in abdomen and pelvis    Elevated lipase - normalized on last check    AKI (acute kidney injury) ([place]) - stable, will reassess S creatinine today.    Restless leg syndrome   DVT prophylaxis: heparin Code Status: Full Family Communication: discussed with grand daughter 73 number  Disposition Plan: Patient complaining of right foot discomfort she reports being unable to bear weight on it and complains of discomfort. We'll plan on investigating   Consultants:   None   Procedures: Echo reporting EF of 55 % with grade 1 DD, VQ scan negative  Antimicrobials: None  Subjective: No new complaints reported. No acute issues overnight  Objective: Vitals:   12/01/16 2011  12/01/16 2121 12/02/16 0527 12/02/16 0917  BP:  (!) 148/63 (!) 147/58 (!) 132/46  Pulse:  76 66 69  Resp:  17 18 18   Temp:  98.6 F (37 C) 98.1 F (36.7 C) 98.1 F (36.7 C)    TempSrc:  Oral Oral Oral  SpO2: 92% 95% 93% 96%  Weight:  67.7 kg (149 lb 4 oz)    Height:        Intake/Output Summary (Last 24 hours) at 12/02/16 0955 Last data filed at 12/02/16 0917  Gross per 24 hour  Intake             1020 ml  Output                0 ml  Net             1020 ml   Filed Weights   11/28/16 1223 11/29/16 0300 12/01/16 2121  Weight: 67.5 kg (148 lb 13 oz) 65.6 kg (144 lb 10 oz) 67.7 kg (149 lb 4 oz)    Examination:  General exam:Patient is in no acute distress, alert and awake Respiratory system: Equal chest rise, prolonged expiratory phase, no wheezes Cardiovascular system: S1 & S2 heard, RRR. No JVD, Gallops or murmurs,  Gastrointestinal system: Non distended, no guarding, no pain on palpation Central nervous system: Alert and oriented. No focal neurological deficits. Extremities: Symmetric 5 x 5 power. Skin: No rashes, lesions or ulcers, No cellulitis noted over right foot Psychiatry:  Mood & affect appropriate.   Data Reviewed: I have personally reviewed following labs and imaging studies  CBC:  Recent Labs Lab 11/28/16 0055 11/29/16 0334  WBC 8.1 6.9  NEUTROABS 4.9  --   HGB 10.1* 9.5*  HCT 32.0* 29.9*  MCV 90.7 91.2  PLT 359 528   Basic Metabolic Panel:  Recent Labs Lab 11/28/16 0055 11/28/16 0343 11/29/16 0334 11/30/16 0412 12/01/16 0904  NA 136  --  137 135 137  K 5.5* 5.1 5.5* 4.7 4.5  CL 103  --  104 102 103  CO2 26  --  26 22 25   GLUCOSE 179*  --  88 307* 231*  BUN 26*  --  25* 38* 41*  CREATININE 1.89*  --  1.72* 1.90* 1.69*  CALCIUM 9.0  --  8.9 9.0 9.3  MG  --   --   --   --  1.8  PHOS  --   --   --   --  3.0   GFR: Estimated Creatinine Clearance: 26.3 mL/min (A) (by C-G formula based on SCr of 1.69 mg/dL (H)). Liver Function Tests:  Recent Labs Lab 11/28/16 0055 11/30/16 0412  AST 20 17  ALT 15 14  ALKPHOS 86 101  BILITOT 0.8 0.7  PROT 6.3* 6.3*  ALBUMIN 3.1* 3.1*    Recent Labs Lab 11/28/16 0055  11/29/16 1430  LIPASE 84* 29   No results for input(s): AMMONIA in the last 168 hours. Coagulation Profile: No results for input(s): INR, PROTIME in the last 168 hours. Cardiac Enzymes: No results for input(s): CKTOTAL, CKMB, CKMBINDEX, TROPONINI in the last 168 hours. BNP (last 3 results) No results for input(s): PROBNP in the last 8760 hours. HbA1C: No results for input(s): HGBA1C in the last 72 hours. CBG:  Recent Labs Lab 12/01/16 1215 12/01/16 1340 12/01/16 1704 12/01/16 2120 12/02/16 0733  GLUCAP 292* 335* 272* 214* 159*   Lipid Profile: No results for input(s): CHOL, HDL, LDLCALC, TRIG, CHOLHDL, LDLDIRECT in the  last 72 hours. Thyroid Function Tests: No results for input(s): TSH, T4TOTAL, FREET4, T3FREE, THYROIDAB in the last 72 hours. Anemia Panel: No results for input(s): VITAMINB12, FOLATE, FERRITIN, TIBC, IRON, RETICCTPCT in the last 72 hours. Sepsis Labs:  Recent Labs Lab 11/28/16 0123  LATICACIDVEN 1.43    Recent Results (from the past 240 hour(s))  MRSA PCR Screening     Status: None   Collection Time: 11/28/16  2:26 PM  Result Value Ref Range Status   MRSA by PCR NEGATIVE NEGATIVE Final    Comment:        The GeneXpert MRSA Assay (FDA approved for NASAL specimens only), is one component of a comprehensive MRSA colonization surveillance program. It is not intended to diagnose MRSA infection nor to guide or monitor treatment for MRSA infections.          Radiology Studies: No results found.      Scheduled Meds: . amLODipine  5 mg Oral Daily  . atorvastatin  80 mg Oral QHS  . chlorhexidine  15 mL Mouth Rinse BID  . citalopram  10 mg Oral Daily  . doxycycline  100 mg Oral Q12H  . gabapentin  300 mg Oral TID WC  . gabapentin  900 mg Oral QHS  . heparin subcutaneous  5,000 Units Subcutaneous Q8H  . hydrALAZINE  5 mg Intravenous Once  . insulin aspart  0-9 Units Subcutaneous TID WC  . mouth rinse  15 mL Mouth Rinse q12n4p  .  mometasone-formoterol  2 puff Inhalation BID  . pantoprazole  40 mg Oral Daily  . predniSONE  50 mg Oral Q breakfast  . rOPINIRole  0.25 mg Oral Q24H  . rOPINIRole  0.5 mg Oral 2 times per day  . 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 On exam patient did not have any cyanosis and extremity was warm. I have consulted vascular surgeon for further evaluation recommendations. This presents as a new problem as patient initially presented with COPD exacerbation

## 2016-12-02 NOTE — Progress Notes (Signed)
With the patient's permission, this RN released information to the patient's daughter, Modena Jansky.  The daughter is requesting a Case Management Consult  Prior to admission, the patient was on day 10 of 20 day approved Rehab stay at [facility].  She is from home by herself.  The daughter has been informed that [facility] will no longer hold her room.  The daughter also feels that the patient is no longer capable of living by herself safely.  She would like for her mother to finish out the remainder of her rehab days (approved by insurance) at [facility] in [place], [place] in [place].  Once rehab stay complete, the family states the patient could transfer to the skilled nursing side of [place].  Please call daughter, Shauna Hugh, at [phone].  Earleen Reaper RN-BC, [employer]

## 2016-12-02 NOTE — Consult Note (Signed)
Vascular and Vein Specialist of [facility]  Patient name: Tina Dyer MRN: [id number] DOB: 1941/01/19 Sex: female  REASON FOR CONSULT: Right foot pain  HPI: Tina Dyer is a 76 y.o. female, who is seen for evaluation of right foot pain. She is a very pleasant 76 year old female who was admitted with acute exacerbation of her COPD. Reports she had very difficult time with catching her breath and presented to the hospital. She does report that over the last 3-4 days she has been having worsening pain in her right foot. Her history is complicated by the fact that she had had a prior severe stroke affecting the right side of her body with paralysis of her right arm and right leg. This was approximately 2010. She has had improvement since that time. She is right-handed. She does have minimal function in her right hand but does walk and has a cockup brace that she has worn since the time of her surgery. She does report some chronic discomfort in her leg but reports that over the past several days this is been worse and it is difficult for her to go up and down stairs related to this. She reports that for years she has had a sensation of coldness in her her right foot. No claudication type symptoms. She denies any difficulty with her left foot. Does have a history of prior coronary artery stenting. No history of peripheral vascular occlusive disease. Does not recall the full workup but does not recall any discussion of carotid disease  Past Medical History:  Diagnosis Date  . Arthritis   . CHF (congestive heart failure) ([place])   . Chronic leg pain    right leg nerve pain  . COPD (chronic obstructive pulmonary disease) ([place])   . Coronary artery disease last cardiologist visit 01 / 2017 approx.  in [place] (pt was living w/ her daughter)   pt hx MI 2001 w/ no intervention/  2002 cardiac cath w/ stenting --- pt currently is monitored by pcp  . Family history of  adverse reaction to anesthesia    daughter- had problems with breathing   . History of kidney stones   . History of MI (myocardial infarction) 2001   in setting ishcemic CVA  . History of stroke with current residual effects 2001   left side ischemic stroke w/ right hemiplegia residual  . Hyperlipidemia, mixed   . Hypertension   . Insomnia   . Myocardial infarction ([place]) 2001  . Nephrolithiasis    bilateral non-obstructive per ct 09-21-2016  . Pulmonary nodules    RUL,  RLL,  LLL  per CT 09-20-2015 w/ [facility] ,  currently monitored by pcp  . Restless leg syndrome   . Right hemiplegia ([place])    2001  residual ischemic stroke   . Right renal mass    renal pelvis  . S/P coronary artery stent placement    2002  unknown details but was done at [place]  . Stroke [facility]) 2002   affected right arm and leg   . Type 2 diabetes mellitus ([place])    type II   . Wears glasses     Family History  Problem Relation Age of Onset  . Heart disease Mother   . Diabetes Mellitus II Mother   . Heart disease Father   . Diabetes Mellitus II Father   . Heart disease Other        Had 20 sibblings and all have had heart  issues.    SOCIAL HISTORY: Social History   Social History  . Marital status: Divorced    Spouse name: N/A  . Number of children: N/A  . Years of education: N/A   Occupational History  . Not on file.   Social History Main Topics  . Smoking status: Former Smoker    Packs/day: 2.00    Years: 50.00    Types: Cigarettes    Quit date: 10/03/2012  . Smokeless tobacco: Never Used  . Alcohol use No  . Drug use: No  . Sexual activity: Not on file   Other Topics Concern  . Not on file   Social History Narrative  . No narrative on file    Allergies  Allergen Reactions  . Sulfa Antibiotics Hives    blisters  . Codeine Hives  . Latex Hives  . Penicillins Hives    Has patient had a PCN reaction causing immediate rash, facial/tongue/throat swelling, SOB or  lightheadedness with hypotension: No Has patient had a PCN reaction causing severe rash involving mucus membranes or skin necrosis: No Has patient had a PCN reaction that required hospitalization No Has patient had a PCN reaction occurring within the last 10 years: No If all of the above answers are "NO", then may proceed with Cephalosporin use.      Current Facility-Administered Medications  Medication Dose Route Frequency Provider Last Rate Last Dose  . albuterol (PROVENTIL) (2.5 MG/3ML) 0.083% nebulizer solution 2.5 mg  2.5 mg Nebulization Q4H PRN Velvet Bathe, MD      . amLODipine (NORVASC) tablet 5 mg  5 mg Oral Daily Reubin Milan, MD   5 mg at 12/02/16 1035  . atorvastatin (LIPITOR) tablet 80 mg  80 mg Oral QHS Reubin Milan, MD   80 mg at 12/01/16 2253  . chlorhexidine (PERIDEX) 0.12 % solution 15 mL  15 mL Mouth Rinse BID Velvet Bathe, MD   15 mL at 12/02/16 1036  . citalopram (CELEXA) tablet 10 mg  10 mg Oral Daily Reubin Milan, MD   10 mg at 12/02/16 1035  . doxycycline (VIBRA-TABS) tablet 100 mg  100 mg Oral Q12H Velvet Bathe, MD   100 mg at 12/02/16 1035  . gabapentin (NEURONTIN) capsule 300 mg  300 mg Oral TID WC Velvet Bathe, MD   300 mg at 12/02/16 1821  . gabapentin (NEURONTIN) capsule 900 mg  900 mg Oral QHS Velvet Bathe, MD   900 mg at 12/01/16 2253  . heparin injection 5,000 Units  5,000 Units Subcutaneous Q8H Tyrone Apple, RPH   5,000 Units at 12/02/16 1358  . hydrALAZINE (APRESOLINE) injection 5 mg  5 mg Intravenous Once Reubin Milan, MD      . insulin aspart (novoLOG) injection 0-9 Units  0-9 Units Subcutaneous TID WC Velvet Bathe, MD   7 Units at 12/02/16 1820  . MEDLINE mouth rinse  15 mL Mouth Rinse q12n4p Velvet Bathe, MD   15 mL at 12/02/16 1604  . mometasone-formoterol (DULERA) 200-5 MCG/ACT inhaler 2 puff  2 puff Inhalation BID Reubin Milan, MD   2 puff at 12/02/16 2056  . ondansetron (ZOFRAN-ODT) disintegrating tablet 4 mg   4 mg Oral Q8H PRN Reubin Milan, MD   4 mg at 12/01/16 0900  . pantoprazole (PROTONIX) EC tablet 40 mg  40 mg Oral Daily Reubin Milan, MD   40 mg at 12/02/16 1036  . predniSONE (DELTASONE) tablet 50 mg  50 mg Oral Q  breakfast Velvet Bathe, MD   50 mg at 12/02/16 3299  . rOPINIRole (REQUIP) tablet 0.25 mg  0.25 mg Oral Q24H Reubin Milan, MD   0.25 mg at 12/02/16 1603  . rOPINIRole (REQUIP) tablet 0.5 mg  0.5 mg Oral 2 times per day Velvet Bathe, MD   0.5 mg at 12/01/16 2253  . traMADol (ULTRAM) tablet 50 mg  50 mg Oral Q6H PRN Reubin Milan, MD   50 mg at 12/02/16 1408  . umeclidinium bromide (INCRUSE ELLIPTA) 62.5 MCG/INH 1 puff  1 puff Inhalation Daily Reubin Milan, MD   1 puff at 12/02/16 1158    REVIEW OF SYSTEMS:  [X]  denotes positive finding, [ ]  denotes negative finding Cardiac  Comments:  Chest pain or chest pressure:    Shortness of breath upon exertion: x   Short of breath when lying flat: x   Irregular heart rhythm:        Vascular    Pain in calf, thigh, or hip brought on by ambulation:    Pain in feet at night that wakes you up from your sleep:  x   Blood clot in your veins:    Leg swelling:         Pulmonary    Oxygen at home: x   Productive cough:  x   Wheezing:         Neurologic    Sudden weakness in arms or legs:  x   Sudden numbness in arms or legs:  x   Sudden onset of difficulty speaking or slurred speech: x   Temporary loss of vision in one eye:     Problems with dizziness:         Gastrointestinal    Blood in stool:     Vomited blood:         Genitourinary    Burning when urinating:     Blood in urine:        Psychiatric    Major depression:         Hematologic    Bleeding problems:    Problems with blood clotting too easily:        Skin    Rashes or ulcers:        Constitutional    Fever or chills:      PHYSICAL EXAM: Vitals:   12/02/16 1158 12/02/16 1831 12/02/16 2056 12/02/16 2120  BP:  (!) 156/85   (!) 146/70  Pulse:  78  73  Resp:  18  18  Temp:  98.5 F (36.9 C)  98.4 F (36.9 C)  TempSrc:  Oral  Oral  SpO2: 91% 97% 96% 96%  Weight:      Height:        GENERAL: The patient is a well-nourished female, in no acute distress. The vital signs are documented above. CARDIOVASCULAR: 2+ right femoral and left femoral pulse. Absent right popliteal and 1+ left popliteal pulse. Absent pedal pulses PULMONARY: There is good air exchange  ABDOMEN: Soft and non-tender  MUSCULOSKELETAL: There are no major deformities or cyanosis. NEUROLOGIC: No focal weakness or paresthesias are detected. Weakness in her right side related to her prior stroke. Difficult to assess her level of numbness related to this on the right. Her foot is warm. SKIN: There are no ulcers or rashes noted. PSYCHIATRIC: The patient has a normal affect.  DATA:  Noninvasive studies from today revealed right ankle arm index of 0.44 on the left 0.87.  MEDICAL  ISSUES: Unclear as to the cause of her apparent acute worsening of ischemia in her right leg. At this does to appear to have happened over the past 3-4 days. Her foot is certainly viable at present time. She is unable to go up and down stairs in her apartment over the past several days because of this discomfort. We will plan on arteriography tomorrow and possible intervention. Does have a prior history of nephrectomy in her creatinine is 1.6 therefore will limit dye as much as possible. Patient understands the plan and also understands my partners will be doing the arteriogram   Rosetta Posner, MD [facility] Vascular and Vein Specialists of [facility] Tel [phone] Pager [phone]

## 2016-12-03 ENCOUNTER — Encounter (HOSPITAL_COMMUNITY): Payer: Self-pay | Admitting: Vascular Surgery

## 2016-12-03 ENCOUNTER — Encounter (HOSPITAL_COMMUNITY)
Admission: EM | Disposition: A | Discharge status: Skilled Nursing Facility | Payer: Self-pay | Source: Home / Self Care | Attending: Family Medicine

## 2016-12-03 DIAGNOSIS — I998 Other disorder of circulatory system: Secondary | ICD-10-CM

## 2016-12-03 HISTORY — PX: ABDOMINAL AORTOGRAM W/LOWER EXTREMITY: CATH118223

## 2016-12-03 LAB — BASIC METABOLIC PANEL
ANION GAP: 10 (ref 5–15)
BUN: 43 mg/dL — AB (ref 6–20)
CO2: 25 mmol/L (ref 22–32)
Calcium: 9.4 mg/dL (ref 8.9–10.3)
Chloride: 102 mmol/L (ref 101–111)
Creatinine, Ser: 1.61 mg/dL — ABNORMAL HIGH (ref 0.44–1.00)
GFR calc Af Amer: 35 mL/min — ABNORMAL LOW (ref >60)
GFR calc non Af Amer: 30 mL/min — ABNORMAL LOW (ref >60)
GLUCOSE: 180 mg/dL — AB (ref 65–99)
POTASSIUM: 4.8 mmol/L (ref 3.5–5.1)
Sodium: 137 mmol/L (ref 135–145)

## 2016-12-03 LAB — CREATININE, SERUM
Creatinine, Ser: 1.48 mg/dL — ABNORMAL HIGH (ref 0.44–1.00)
GFR calc Af Amer: 39 mL/min — ABNORMAL LOW (ref >60)
GFR calc non Af Amer: 34 mL/min — ABNORMAL LOW (ref >60)

## 2016-12-03 LAB — CBC
HCT: 31.3 % — ABNORMAL LOW (ref 36.0–46.0)
HEMATOCRIT: 35.7 % — AB (ref 36.0–46.0)
Hemoglobin: 10 g/dL — ABNORMAL LOW (ref 12.0–15.0)
Hemoglobin: 11.5 g/dL — ABNORMAL LOW (ref 12.0–15.0)
MCH: 28.2 pg (ref 26.0–34.0)
MCH: 28.6 pg (ref 26.0–34.0)
MCHC: 31.9 g/dL (ref 30.0–36.0)
MCHC: 32.2 g/dL (ref 30.0–36.0)
MCV: 88.4 fL (ref 78.0–100.0)
MCV: 88.8 fL (ref 78.0–100.0)
PLATELETS: 401 10*3/uL — AB (ref 150–400)
PLATELETS: 288 10*3/uL (ref 150–400)
RBC: 3.54 MIL/uL — AB (ref 3.87–5.11)
RBC: 4.02 MIL/uL (ref 3.87–5.11)
RDW: 13.2 % (ref 11.5–15.5)
RDW: 13.1 % (ref 11.5–15.5)
WBC: 6.5 10*3/uL (ref 4.0–10.5)
WBC: 6 10*3/uL (ref 4.0–10.5)

## 2016-12-03 LAB — GLUCOSE, CAPILLARY
GLUCOSE-CAPILLARY: 115 mg/dL — AB (ref 65–99)
Glucose-Capillary: 109 mg/dL — ABNORMAL HIGH (ref 65–99)
Glucose-Capillary: 165 mg/dL — ABNORMAL HIGH (ref 65–99)
Glucose-Capillary: 210 mg/dL — ABNORMAL HIGH (ref 65–99)

## 2016-12-03 SURGERY — ABDOMINAL AORTOGRAM W/LOWER EXTREMITY
Anesthesia: LOCAL

## 2016-12-03 MED ORDER — HEPARIN (PORCINE) IN NACL 2-0.9 UNIT/ML-% IJ SOLN
INTRAMUSCULAR | Status: AC
  Filled 2016-12-03: qty 1000

## 2016-12-03 MED ORDER — FENTANYL CITRATE (PF) 100 MCG/2ML IJ SOLN
INTRAMUSCULAR | Status: DC | PRN
  Administered 2016-12-03: 50 ug via INTRAVENOUS

## 2016-12-03 MED ORDER — MIDAZOLAM HCL 2 MG/2ML IJ SOLN
INTRAMUSCULAR | Status: AC
Start: 2016-12-03 — End: 2016-12-03
  Filled 2016-12-03: qty 2

## 2016-12-03 MED ORDER — HEPARIN (PORCINE) IN NACL 2-0.9 UNIT/ML-% IJ SOLN
INTRAMUSCULAR | Status: AC | PRN
  Administered 2016-12-03: 1000 mL via INTRA_ARTERIAL

## 2016-12-03 MED ORDER — SODIUM CHLORIDE 0.9 % IV SOLN
Freq: Once | INTRAVENOUS | Status: AC
  Administered 2016-12-03: 09:00:00 via INTRAVENOUS

## 2016-12-03 MED ORDER — FENTANYL CITRATE (PF) 100 MCG/2ML IJ SOLN
INTRAMUSCULAR | Status: AC
  Filled 2016-12-03: qty 2

## 2016-12-03 MED ORDER — MIDAZOLAM HCL 2 MG/2ML IJ SOLN
INTRAMUSCULAR | Status: DC | PRN
  Administered 2016-12-03: 1 mg via INTRAVENOUS

## 2016-12-03 MED ORDER — LIDOCAINE HCL (PF) 1 % IJ SOLN
INTRAMUSCULAR | Status: DC | PRN
  Administered 2016-12-03: 12 mL via SUBCUTANEOUS

## 2016-12-03 MED ORDER — IODIXANOL 320 MG/ML IV SOLN
INTRAVENOUS | Status: DC | PRN
  Administered 2016-12-03: 60 mL via INTRA_ARTERIAL

## 2016-12-03 MED ORDER — LIDOCAINE HCL (PF) 1 % IJ SOLN
INTRAMUSCULAR | Status: AC
  Filled 2016-12-03: qty 30

## 2016-12-03 MED ORDER — SODIUM CHLORIDE 0.9 % IV SOLN
INTRAVENOUS | Status: DC
  Administered 2016-12-03: 05:00:00 via INTRAVENOUS

## 2016-12-03 SURGICAL SUPPLY — 13 items
CATH OMNI FLUSH 5F 65CM (CATHETERS) ×2 IMPLANT
COVER PRB 48X5XTLSCP FOLD TPE (BAG) ×1 IMPLANT
COVER PROBE 5X48 (BAG) ×2
FILTER CO2 0.2 MICRON (VASCULAR PRODUCTS) ×2 IMPLANT
KIT MICROINTRODUCER STIFF 5F (SHEATH) ×1 IMPLANT
KIT PV (KITS) ×2 IMPLANT
RESERVOIR CO2 (VASCULAR PRODUCTS) ×2 IMPLANT
SET FLUSH CO2 (MISCELLANEOUS) ×1 IMPLANT
SHEATH PINNACLE 5F 10CM (SHEATH) ×2 IMPLANT
SYR MEDRAD MARK V 150ML (SYRINGE) ×2 IMPLANT
TRANSDUCER W/STOPCOCK (MISCELLANEOUS) ×2 IMPLANT
TRAY PV CATH (CUSTOM PROCEDURE TRAY) ×2 IMPLANT
WIRE BENTSON .035X145CM (WIRE) ×2 IMPLANT

## 2016-12-03 NOTE — Op Note (Signed)
OPERATIVE NOTE   PROCEDURE: 1.  Left common femoral artery cannulation under ultrasound guidance 2.  Placement of catheter in aorta 3.  Aortogram with carbon dioxide 4.  Second order arterial selection 5.  Right leg runoff 6.  Conscious sedation for 26 minutes  PRE-OPERATIVE DIAGNOSIS: right leg ischemia  POST-OPERATIVE DIAGNOSIS: same as above   SURGEON: Adele Barthel, MD  ANESTHESIA: conscious sedation  ESTIMATED BLOOD LOSS: 50 cc  CONTRAST: 60 cc IV contrast, 240 cc carbon dioxide  FINDING(S):  Aorta: patent  Superior mesenteric artery: patent Celiac artery: patent   Right Left  RA Not visualized Not visualized  CIA patent patent  EIA patent patent  IIA patent patent  CFA patent patent  SFA Flush occlusion   PFA Patent, extensive collaterals to above-the-knee popliteal artery   Pop Patent, multiple high grade stenoses >75% including sub-total occlusion at-knee segment, below-the-knee target roughly 3-3.5 mm   Trif Patent tibioperoneal trunk    AT Occluded   Pero Patent, collaterals perfuse foot   PT Occluded    SPECIMEN(S):  none  INDICATIONS:   Tina Dyer is a 76 y.o. female who presents with right leg ischemia.  The patient presents for: aortogram, right leg runoff, and possible intervention.  Due to chronic kidney disease stage 2-3, I plan on using a combination of carbon dioxide and IV contrast to minimize risk of contrast induced nephropathy.  I discussed with the patient the nature of angiographic procedures, especially the limited patencies of any endovascular intervention.  The patient is aware of that the risks of an angiographic procedure include but are not limited to: bleeding, infection, access site complications, renal failure, embolization, rupture of vessel, dissection, possible need for emergent surgical intervention, possible need for surgical procedures to treat the patient's pathology, and stroke and death.  The patient is aware of the risks  and agrees to proceed.  DESCRIPTION: After full informed consent was obtained from the patient, the patient was brought back to the angiography suite.  The patient was placed supine upon the angiography table and connected to cardiopulmonary monitoring equipment.  The patient was then given conscious sedation, the amounts of which are documented in the patient's chart.  A circulating radiologic technician maintained continuous monitoring of the patient's cardiopulmonary status.  Additionally, the control room radiologic technician provided backup monitoring throughout the procedure.  The patient was prepped and drape in the standard fashion for an angiographic procedure.  At this point, attention was turned to the left groin.  Under ultrasound guidance, the subcutaneous tissue surrounding the left common femoral artery was anesthesized with 1% lidocaine with epinephrine.  The artery was then cannulated with a micropuncture needle.  The microwire was advanced into the iliac arterial system.  The needle was exchanged for a microsheath, which was loaded into the common femoral artery over the wire.  The microwire was exchanged for a Bentson wire which was advanced into the aorta.  The microsheath was then exchanged for a 5-Fr sheath which was loaded into the common femoral artery.  The Omniflush catheter was then loaded over the wire up to the level of L1.  The catheter was connected to the carbon dioxide circuit.  After de-airring and de-clotting the circuit, a carbon dioxide aortogram was completed.    Using a Bentson wire and Omniflush catheter, the right common iliac artery was selected.  The catheter and wire were advanced into the external iliac artery.  The wire was removed and the catheter connected  to the carbon dioxide circuit.  Hand injections were completed at 3 stations until I reached the calf.  At this point, I felt switching over to IV contrast was necessary to obtain the detail needed to plan  a surgical reconstruction.  The catheter was connected to the power injector circuit.  Three injections at sequential station starting at the distal thigh was completed to image the right leg distal arterial system.  Based on the images, the patient's anatomy is compatible with a common femoral artery to below-the-knee popliteal bypass.  I replaced the wire into the catheter, straightening out the crook in the catheter.  Both were removed from the sheath together.  The sheath was aspirated.  No clots were present and the sheath was reloaded with heparinized saline.     COMPLICATIONS: none  CONDITION: stable   Adele Barthel, MD, [facility] Vascular and Vein Specialists of [place] Office: [phone] Pager: [phone]  12/03/2016, 8:32 AM

## 2016-12-03 NOTE — H&P (View-Only) (Signed)
Vascular and Vein Specialist of [facility]  Patient name: Tina Dyer MRN: [id number] DOB: 12-Dec-1940 Sex: female  REASON FOR CONSULT: Right foot pain  HPI: Tina Dyer is a 76 y.o. female, who is seen for evaluation of right foot pain. She is a very pleasant 76 year old female who was admitted with acute exacerbation of her COPD. Reports she had very difficult time with catching her breath and presented to the hospital. She does report that over the last 3-4 days she has been having worsening pain in her right foot. Her history is complicated by the fact that she had had a prior severe stroke affecting the right side of her body with paralysis of her right arm and right leg. This was approximately 2010. She has had improvement since that time. She is right-handed. She does have minimal function in her right hand but does walk and has a cockup brace that she has worn since the time of her surgery. She does report some chronic discomfort in her leg but reports that over the past several days this is been worse and it is difficult for her to go up and down stairs related to this. She reports that for years she has had a sensation of coldness in her her right foot. No claudication type symptoms. She denies any difficulty with her left foot. Does have a history of prior coronary artery stenting. No history of peripheral vascular occlusive disease. Does not recall the full workup but does not recall any discussion of carotid disease  Past Medical History:  Diagnosis Date  . Arthritis   . CHF (congestive heart failure) ([place])   . Chronic leg pain    right leg nerve pain  . COPD (chronic obstructive pulmonary disease) ([place])   . Coronary artery disease last cardiologist visit 01 / 2017 approx.  in [place]  (pt was living w/ her daughter)   pt hx MI 2001 w/ no intervention/  2002 cardiac cath w/ stenting --- pt currently is monitored by pcp  . Family history of  adverse reaction to anesthesia    daughter- had problems with breathing   . History of kidney stones   . History of MI (myocardial infarction) 2001   in setting ishcemic CVA  . History of stroke with current residual effects 2001   left side ischemic stroke w/ right hemiplegia residual  . Hyperlipidemia, mixed   . Hypertension   . Insomnia   . Myocardial infarction ([place]) 2001  . Nephrolithiasis    bilateral non-obstructive per ct 09-21-2016  . Pulmonary nodules    RUL,  RLL,  LLL  per CT 09-20-2015 w/ [facility] ,  currently monitored by pcp  . Restless leg syndrome   . Right hemiplegia ([place])    2001  residual ischemic stroke   . Right renal mass    renal pelvis  . S/P coronary artery stent placement    2002  unknown details but was done at [place]  . Stroke [facility]) 2002   affected right arm and leg   . Type 2 diabetes mellitus ([place])    type II   . Wears glasses     Family History  Problem Relation Age of Onset  . Heart disease Mother   . Diabetes Mellitus II Mother   . Heart disease Father   . Diabetes Mellitus II Father   . Heart disease Other        Had 20 sibblings and all have had heart  issues.    SOCIAL HISTORY: Social History   Social History  . Marital status: Divorced    Spouse name: N/A  . Number of children: N/A  . Years of education: N/A   Occupational History  . Not on file.   Social History Main Topics  . Smoking status: Former Smoker    Packs/day: 2.00    Years: 50.00    Types: Cigarettes    Quit date: 10/03/2012  . Smokeless tobacco: Never Used  . Alcohol use No  . Drug use: No  . Sexual activity: Not on file   Other Topics Concern  . Not on file   Social History Narrative  . No narrative on file    Allergies  Allergen Reactions  . Sulfa Antibiotics Hives    blisters  . Codeine Hives  . Latex Hives  . Penicillins Hives    Has patient had a PCN reaction causing immediate rash, facial/tongue/throat swelling, SOB or  lightheadedness with hypotension: No Has patient had a PCN reaction causing severe rash involving mucus membranes or skin necrosis: No Has patient had a PCN reaction that required hospitalization No Has patient had a PCN reaction occurring within the last 10 years: No If all of the above answers are "NO", then may proceed with Cephalosporin use.      Current Facility-Administered Medications  Medication Dose Route Frequency Provider Last Rate Last Dose  . albuterol (PROVENTIL) (2.5 MG/3ML) 0.083% nebulizer solution 2.5 mg  2.5 mg Nebulization Q4H PRN Velvet Bathe, MD      . amLODipine (NORVASC) tablet 5 mg  5 mg Oral Daily Reubin Milan, MD   5 mg at 12/02/16 1035  . atorvastatin (LIPITOR) tablet 80 mg  80 mg Oral QHS Reubin Milan, MD   80 mg at 12/01/16 2253  . chlorhexidine (PERIDEX) 0.12 % solution 15 mL  15 mL Mouth Rinse BID Velvet Bathe, MD   15 mL at 12/02/16 1036  . citalopram (CELEXA) tablet 10 mg  10 mg Oral Daily Reubin Milan, MD   10 mg at 12/02/16 1035  . doxycycline (VIBRA-TABS) tablet 100 mg  100 mg Oral Q12H Velvet Bathe, MD   100 mg at 12/02/16 1035  . gabapentin (NEURONTIN) capsule 300 mg  300 mg Oral TID WC Velvet Bathe, MD   300 mg at 12/02/16 1821  . gabapentin (NEURONTIN) capsule 900 mg  900 mg Oral QHS Velvet Bathe, MD   900 mg at 12/01/16 2253  . heparin injection 5,000 Units  5,000 Units Subcutaneous Q8H Tyrone Apple, RPH   5,000 Units at 12/02/16 1358  . hydrALAZINE (APRESOLINE) injection 5 mg  5 mg Intravenous Once Reubin Milan, MD      . insulin aspart (novoLOG) injection 0-9 Units  0-9 Units Subcutaneous TID WC Velvet Bathe, MD   7 Units at 12/02/16 1820  . MEDLINE mouth rinse  15 mL Mouth Rinse q12n4p Velvet Bathe, MD   15 mL at 12/02/16 1604  . mometasone-formoterol (DULERA) 200-5 MCG/ACT inhaler 2 puff  2 puff Inhalation BID Reubin Milan, MD   2 puff at 12/02/16 2056  . ondansetron (ZOFRAN-ODT) disintegrating tablet 4 mg   4 mg Oral Q8H PRN Reubin Milan, MD   4 mg at 12/01/16 0900  . pantoprazole (PROTONIX) EC tablet 40 mg  40 mg Oral Daily Reubin Milan, MD   40 mg at 12/02/16 1036  . predniSONE (DELTASONE) tablet 50 mg  50 mg Oral Q  breakfast Velvet Bathe, MD   50 mg at 12/02/16 1062  . rOPINIRole (REQUIP) tablet 0.25 mg  0.25 mg Oral Q24H Reubin Milan, MD   0.25 mg at 12/02/16 1603  . rOPINIRole (REQUIP) tablet 0.5 mg  0.5 mg Oral 2 times per day Velvet Bathe, MD   0.5 mg at 12/01/16 2253  . traMADol (ULTRAM) tablet 50 mg  50 mg Oral Q6H PRN Reubin Milan, MD   50 mg at 12/02/16 1408  . umeclidinium bromide (INCRUSE ELLIPTA) 62.5 MCG/INH 1 puff  1 puff Inhalation Daily Reubin Milan, MD   1 puff at 12/02/16 1158    REVIEW OF SYSTEMS:  [X]  denotes positive finding, [ ]  denotes negative finding Cardiac  Comments:  Chest pain or chest pressure:    Shortness of breath upon exertion: x   Short of breath when lying flat: x   Irregular heart rhythm:        Vascular    Pain in calf, thigh, or hip brought on by ambulation:    Pain in feet at night that wakes you up from your sleep:  x   Blood clot in your veins:    Leg swelling:         Pulmonary    Oxygen at home: x   Productive cough:  x   Wheezing:         Neurologic    Sudden weakness in arms or legs:  x   Sudden numbness in arms or legs:  x   Sudden onset of difficulty speaking or slurred speech: x   Temporary loss of vision in one eye:     Problems with dizziness:         Gastrointestinal    Blood in stool:     Vomited blood:         Genitourinary    Burning when urinating:     Blood in urine:        Psychiatric    Major depression:         Hematologic    Bleeding problems:    Problems with blood clotting too easily:        Skin    Rashes or ulcers:        Constitutional    Fever or chills:      PHYSICAL EXAM: Vitals:   12/02/16 1158 12/02/16 1831 12/02/16 2056 12/02/16 2120  BP:  (!) 156/85   (!) 146/70  Pulse:  78  73  Resp:  18  18  Temp:  98.5 F (36.9 C)  98.4 F (36.9 C)  TempSrc:  Oral  Oral  SpO2: 91% 97% 96% 96%  Weight:      Height:        GENERAL: The patient is a well-nourished female, in no acute distress. The vital signs are documented above. CARDIOVASCULAR: 2+ right femoral and left femoral pulse. Absent right popliteal and 1+ left popliteal pulse. Absent pedal pulses PULMONARY: There is good air exchange  ABDOMEN: Soft and non-tender  MUSCULOSKELETAL: There are no major deformities or cyanosis. NEUROLOGIC: No focal weakness or paresthesias are detected. Weakness in her right side related to her prior stroke. Difficult to assess her level of numbness related to this on the right. Her foot is warm. SKIN: There are no ulcers or rashes noted. PSYCHIATRIC: The patient has a normal affect.  DATA:  Noninvasive studies from today revealed right ankle arm index of 0.44 on the left 0.87.  MEDICAL  ISSUES: Unclear as to the cause of her apparent acute worsening of ischemia in her right leg. At this does to appear to have happened over the past 3-4 days. Her foot is certainly viable at present time. She is unable to go up and down stairs in her apartment over the past several days because of this discomfort. We will plan on arteriography tomorrow and possible intervention. Does have a prior history of nephrectomy in her creatinine is 1.6 therefore will limit dye as much as possible. Patient understands the plan and also understands my partners will be doing the arteriogram   Rosetta Posner, MD [facility] Vascular and Vein Specialists of [facility] Tel [phone] Pager [phone]

## 2016-12-03 NOTE — Progress Notes (Signed)
PROGRESS NOTE    Tina Dyer  YIA:[id number] DOB: 1940-12-12 DOA: 11/28/2016 PCP: Tina Aly, FNP    Brief Narrative:  76 y.o. female with medical history significant of osteoarthritis, unspecified CHF, chronic leg pain, COPD, CAD with history of MI in 2001 with no intervention, cardiac cath with stent placement in 2002, urolithiasis, previous CVA with right-sided deficit, hyperlipidemia, hypertension, insomnia, pulmonary nodules, type 2 diabetes,S/P RIGHT robotic nephroureterectomy on 11/15/16 by Dr. Tresa Moore who is brought via EMS from [place] home after the staff noticed that she was dyspneic with wheezing rhonchi and rales since the afternoon. Of note patient is currently covered with heparin drip until PE ruled out. PE ruled out and heparin was discontinued.  Patient complaining of right foot pain she states that putting any pressure on is uncomfortable.  Assessment & Plan:   Principal Problem: Non sustained VTACH - for 6 beats. Has resolved and metabolites within normal limits. Most likely precipitated secondary to COPD exacerbation. Patient has been more than 24 hours without any episodes. An echocardiogram reporting EF of 55 percent. We'll discontinue cardiac monitoring given resolution. Of note patient has had periods of bradycardia such we are unable to use beta blocker secondary to bradycardia and COPD.   Right foot discomfort - uric acid levels within normal limits - Ankle brachial index resulted and reported severe arterial insufficiency at rest. Vascular surgery consulted. Vascular performed left common femoral artery cannulation under ultrasound guidance aortogram with carbon dioxide and right leg runoff. Please refer to op note 12/03/2016. Findings showed patent aorta, superior mesenteric artery, celiac artery. Awaiting final recommendations by vascular surgeon. - No bony abnormalities on physical exam or history of trauma to warrant x-ray  Bradycardia - mild,  Blood pressures stable. Patient asymptomatic    Acute respiratory failure with hypoxia (HCC)/Acute COPD exacerbation - BNP was within normal limits - COPD exacerbation exacerbation most likely cause given negative work up for PE and normal BNP - I obtained VQ scan which reported low probability for thromboembolism  - Continue doxycycline and will plan for seven-day treatment regimen 4/7  Hyperkalemia - Resolved and within normal limits on last check  Active Problems:   Type 2 diabetes mellitus ([name]) - initially controlled with diet but started prednisone. As such will place order for SSI sensitive scale.    Hyperlipidemia, mixed - Stable on on lipitor. No chest pain reported    Coronary artery disease - no chest pain reported - pt on statin    Pulmonary nodules - as per radiology recommendations patient will need repeat imaging:  Non-contrast chest CT at 3-6 months is recommended    History of stroke with current residual effects - Statin and secondary stroke prevention    Hypertension -We'll controlled on amlodipine will continue.    COPD (chronic obstructive pulmonary disease) ([name]) - Patient started on prednisone and doxycycline improving on this regimen. Has not required BiPAP -Continue Dulera, will add when necessary albuterol    Abdominal pain - CT scan of abdomen reported no acute findings in abdomen and pelvis    Elevated lipase - normalized on last check    AKI (acute kidney injury) ([name]) - stable, will reassess S creatinine today.    Restless leg syndrome   DVT prophylaxis: heparin Code Status: Full Family Communication: grand daughter is point of contact at 910 number. Tried to reach her today 12/03/2016 but obtain BM Disposition Plan: Pending final recommendations from vascular surgeon  Consultants:   Vascular surgery   Procedures:  Echo reporting EF of 55 % with grade 1 DD, VQ scan negative  Antimicrobials: None  Subjective: Pt has no new  complaints. States that her right lower extremity foot discomfort is improved.  Objective: Vitals:   12/03/16 1105 12/03/16 1135 12/03/16 1405 12/03/16 1618  BP: (!) 162/74 (!) 163/62 (!) 150/76 (!) 160/82  Pulse:      Resp: 19 19 18 19   Temp:      TempSrc:      SpO2:      Weight:      Height:        Intake/Output Summary (Last 24 hours) at 12/03/16 1707 Last data filed at 12/03/16 1637  Gross per 24 hour  Intake              582 ml  Output                0 ml  Net              582 ml   Filed Weights   11/29/16 0300 12/01/16 2121 12/02/16 2240  Weight: 65.6 kg (144 lb 10 oz) 67.7 kg (149 lb 4 oz) 66.2 kg (146 lb)    Examination: Exam unchanged from 12/02/2016  General exam:Patient is in no acute distress, alert and awake Respiratory system: Equal chest rise, prolonged expiratory phase, no wheezes Cardiovascular system: S1 & S2 heard, RRR. No JVD, Gallops or murmurs,  Gastrointestinal system: Non distended, no guarding, no pain on palpation Central nervous system: Alert and oriented. No focal neurological deficits. Extremities: Symmetric 5 x 5 power. Skin: No rashes, lesions or ulcers, No cellulitis noted over right foot Psychiatry:  Mood & affect appropriate.   Data Reviewed: I have personally reviewed following labs and imaging studies  CBC:  Recent Labs Lab 11/28/16 0055 11/29/16 0334 12/03/16 0354 12/03/16 1011  WBC 8.1 6.9 6.5 6.0  NEUTROABS 4.9  --   --   --   HGB 10.1* 9.5* 10.0* 11.5*  HCT 32.0* 29.9* 31.3* 35.7*  MCV 90.7 91.2 88.4 88.8  PLT 359 330 401* 867   Basic Metabolic Panel:  Recent Labs Lab 11/28/16 0055 11/28/16 0343 11/29/16 0334 11/30/16 0412 12/01/16 0904 12/03/16 0354 12/03/16 1011  NA 136  --  137 135 137 137  --   K 5.5* 5.1 5.5* 4.7 4.5 4.8  --   CL 103  --  104 102 103 102  --   CO2 26  --  26 22 25 25   --   GLUCOSE 179*  --  88 307* 231* 180*  --   BUN 26*  --  25* 38* 41* 43*  --   CREATININE 1.89*  --  1.72* 1.90*  1.69* 1.61* 1.48*  CALCIUM 9.0  --  8.9 9.0 9.3 9.4  --   MG  --   --   --   --  1.8  --   --   PHOS  --   --   --   --  3.0  --   --    GFR: Estimated Creatinine Clearance: 30 mL/min (A) (by C-G formula based on SCr of 1.48 mg/dL (H)). Liver Function Tests:  Recent Labs Lab 11/28/16 0055 11/30/16 0412  AST 20 17  ALT 15 14  ALKPHOS 86 101  BILITOT 0.8 0.7  PROT 6.3* 6.3*  ALBUMIN 3.1* 3.1*    Recent Labs Lab 11/28/16 0055 11/29/16 1430  LIPASE 84* 29   No results for input(s): AMMONIA  in the last 168 hours. Coagulation Profile: No results for input(s): INR, PROTIME in the last 168 hours. Cardiac Enzymes: No results for input(s): CKTOTAL, CKMB, CKMBINDEX, TROPONINI in the last 168 hours. BNP (last 3 results) No results for input(s): PROBNP in the last 8760 hours. HbA1C: No results for input(s): HGBA1C in the last 72 hours. CBG:  Recent Labs Lab 12/02/16 1800 12/02/16 2118 12/03/16 0908 12/03/16 1004 12/03/16 1617  GLUCAP 311* 180* 109* 115* 165*   Lipid Profile: No results for input(s): CHOL, HDL, LDLCALC, TRIG, CHOLHDL, LDLDIRECT in the last 72 hours. Thyroid Function Tests: No results for input(s): TSH, T4TOTAL, FREET4, T3FREE, THYROIDAB in the last 72 hours. Anemia Panel: No results for input(s): VITAMINB12, FOLATE, FERRITIN, TIBC, IRON, RETICCTPCT in the last 72 hours. Sepsis Labs:  Recent Labs Lab 11/28/16 0123  LATICACIDVEN 1.43    Recent Results (from the past 240 hour(s))  MRSA PCR Screening     Status: None   Collection Time: 11/28/16  2:26 PM  Result Value Ref Range Status   MRSA by PCR NEGATIVE NEGATIVE Final    Comment:        The GeneXpert MRSA Assay (FDA approved for NASAL specimens only), is one component of a comprehensive MRSA colonization surveillance program. It is not intended to diagnose MRSA infection nor to guide or monitor treatment for MRSA infections.          Radiology Studies: No results  found.      Scheduled Meds: . amLODipine  5 mg Oral Daily  . atorvastatin  80 mg Oral QHS  . chlorhexidine  15 mL Mouth Rinse BID  . citalopram  10 mg Oral Daily  . doxycycline  100 mg Oral Q12H  . gabapentin  300 mg Oral TID WC  . gabapentin  900 mg Oral QHS  . heparin subcutaneous  5,000 Units Subcutaneous Q8H  . hydrALAZINE  5 mg Intravenous Once  . insulin aspart  0-9 Units Subcutaneous TID WC  . mouth rinse  15 mL Mouth Rinse q12n4p  . mometasone-formoterol  2 puff Inhalation BID  . pantoprazole  40 mg Oral Daily  . predniSONE  50 mg Oral Q breakfast  . rOPINIRole  0.25 mg Oral Q24H  . rOPINIRole  0.5 mg Oral 2 times per day  . umeclidinium bromide  1 puff Inhalation Daily   Continuous Infusions:    LOS: 5 days    Time spent: > 35 minutes  Velvet Bathe, MD [facility] Pager [phone]  If 7PM-7AM, please contact night-coverage [email or web address] Password [email or web address] 12/03/2016, 5:07 PM

## 2016-12-03 NOTE — Interval H&P Note (Signed)
   History and Physical Update  The patient was interviewed and re-examined.  The patient's previous History and Physical has been reviewed and is unchanged from Dr. Luther Parody consult.  There is no change in the plan of care: aortogram, right leg runoff, and possible intervention.   I discussed with the patient the nature of angiographic procedures, especially the limited patencies of any endovascular intervention.    The patient is aware of that the risks of an angiographic procedure include but are not limited to: bleeding, infection, access site complications, renal failure, embolization, rupture of vessel, dissection, arteriovenous fistula, possible need for emergent surgical intervention, possible need for surgical procedures to treat the patient's pathology, anaphylactic reaction to contrast, and stroke and death.    The patient is aware of the risks and agrees to proceed.  Laboratory   CBC CBC Latest Ref Rng & Units 12/03/2016 11/29/2016 11/28/2016  WBC 4.0 - 10.5 K/uL 6.5 6.9 8.1  Hemoglobin 12.0 - 15.0 g/dL 10.0(L) 9.5(L) 10.1(L)  Hematocrit 36.0 - 46.0 % 31.3(L) 29.9(L) 32.0(L)  Platelets 150 - 400 K/uL 401(H) 330 359    BMP BMP Latest Ref Rng & Units 12/03/2016 12/01/2016 11/30/2016  Glucose 65 - 99 mg/dL 180(H) 231(H) 307(H)  BUN 6 - 20 mg/dL 43(H) 41(H) 38(H)  Creatinine 0.44 - 1.00 mg/dL 1.61(H) 1.69(H) 1.90(H)  Sodium 135 - 145 mmol/L 137 137 135  Potassium 3.5 - 5.1 mmol/L 4.8 4.5 4.7  Chloride 101 - 111 mmol/L 102 103 102  CO2 22 - 32 mmol/L 25 25 22   Calcium 8.9 - 10.3 mg/dL 9.4 9.3 9.0    Coagulation No results found for: INR, PROTIME No results found for: PTT  Lipids No results found for: CHOL, TRIG, HDL, CHOLHDL, VLDL, LDLCALC, LDLDIRECT   Adele Barthel, MD, FACS [facility] Specialists of [place] Office: [phone] Pager: [phone]  12/03/2016, 7:30 AM

## 2016-12-03 NOTE — Clinical Social Work Note (Signed)
Clinical Social Work Assessment  Patient Details  Name: Tina Dyer MRN: [id number] Date of Birth: Feb 11, 1941  Date of referral:  12/03/16               Reason for consult:  Facility Placement                Permission sought to share information with:  Facility Sport and exercise psychologist, Family Supports Permission granted to share information::  Yes, Verbal Permission Granted  Name::     Tina Dyer  Agency::  [facility]  Relationship::  daughter  Contact Information:  [phone]  Housing/Transportation Living arrangements for the past 2 months:  Apartment Source of Information:  Patient Patient Interpreter Needed:  None Criminal Activity/Legal Involvement Pertinent to Current Situation/Hospitalization:  Yes Significant Relationships:  Adult Children, Other Family Members Lives with:  Other (Comment) (granddaughter, [name]) Do you feel safe going back to the place where you live?  Yes Need for family participation in patient care:  Yes (Comment)  Care giving concerns: Patient was from home with granddaughter, [name]. Patient from [facility] SNF for approx. 10 days before hospital readmission. Not yet re-assessed by PT/OT.   Social Worker assessment / plan: CSW met with patient at bedside and spoke to daughter, Beverlee Nims via phone. Patient and family agreeable for patient to return to SNF at discharge to continue rehab. CSW will send new referral to [facility] and confirm patient can return. CSW will support with discharge back to SNF.  Employment status:  Retired Forensic scientist:  Medicare PT Recommendations:  Not assessed at this time [place] / Referral to community resources:  [place]  Patient/Family's Response to care: Patient and family with appropriate response and appreciative of care.  Patient/Family's Understanding of and Emotional Response to Diagnosis, Current Treatment, and Prognosis: Patient and family with good understanding of  patient's medical conditions. Family hopeful for continued mobility recovery at SNF.  Emotional Assessment Appearance:  Appears stated age Attitude/Demeanor/Rapport:  Other (appropriate) Affect (typically observed):  Appropriate, Calm Orientation:  Oriented to Self, Oriented to Place, Oriented to  Time, Oriented to Situation Alcohol / Substance use:  Not Applicable Psych involvement (Current and /or in the community):  No (Comment)  Discharge Needs  Concerns to be addressed:  Discharge Planning Concerns Readmission within the last 30 days:  Yes Current discharge risk:  Physical Impairment, Dependent with Mobility Barriers to Discharge:  Continued Medical Work up   Estanislado Emms, LCSW 12/03/2016, 4:10 PM

## 2016-12-04 DIAGNOSIS — E118 Type 2 diabetes mellitus with unspecified complications: Secondary | ICD-10-CM

## 2016-12-04 DIAGNOSIS — R918 Other nonspecific abnormal finding of lung field: Secondary | ICD-10-CM

## 2016-12-04 DIAGNOSIS — J441 Chronic obstructive pulmonary disease with (acute) exacerbation: Principal | ICD-10-CM

## 2016-12-04 DIAGNOSIS — N179 Acute kidney failure, unspecified: Secondary | ICD-10-CM

## 2016-12-04 DIAGNOSIS — J9601 Acute respiratory failure with hypoxia: Secondary | ICD-10-CM

## 2016-12-04 DIAGNOSIS — E782 Mixed hyperlipidemia: Secondary | ICD-10-CM

## 2016-12-04 DIAGNOSIS — E875 Hyperkalemia: Secondary | ICD-10-CM

## 2016-12-04 LAB — GLUCOSE, CAPILLARY
GLUCOSE-CAPILLARY: 292 mg/dL — AB (ref 65–99)
Glucose-Capillary: 112 mg/dL — ABNORMAL HIGH (ref 65–99)
Glucose-Capillary: 256 mg/dL — ABNORMAL HIGH (ref 65–99)
Glucose-Capillary: 381 mg/dL — ABNORMAL HIGH (ref 65–99)

## 2016-12-04 NOTE — Evaluation (Signed)
Physical Therapy Evaluation Patient Details Name: Tina Dyer MRN: [id number] DOB: March 20, 1941 Today's Date: 12/04/2016   History of Present Illness  76 y.o. female with medical history significant of osteoarthritis, unspecified CHF, chronic leg pain, COPD, CAD with history of MI in 2001 with no intervention, cardiac cath with stent placement in 2002, urolithiasis, previous CVA with right-sided deficit, hyperlipidemia, hypertension, insomnia, pulmonary nodules, type 2 diabetes,S/P RIGHT robotic nephroureterectomy on 11/15/16 by Dr. Tresa Moore who is brought via EMS from [place] home after the staff noticed that she was dyspneic with wheezing rhonchi   Clinical Impression  Orders received for PT evaluation. Patient demonstrates deficits in functional mobility as indicated below. Will benefit from continued skilled PT to address deficits and maximize function. Will see as indicated and progress as tolerated.  Recommend return to SNF for further rehabilitation.    Follow Up Recommendations SNF (return to SNF)    Equipment Recommendations  None recommended by PT    Recommendations for Other Services       Precautions / Restrictions Precautions Precautions: Fall Required Braces or Orthoses: Other Brace/Splint Other Brace/Splint: R AFO Restrictions Weight Bearing Restrictions: No      Mobility  Bed Mobility               General bed mobility comments: received in chair  Transfers Overall transfer level: Needs assistance Equipment used: Hemi-walker Transfers: Sit to/from Stand Sit to Stand: Min guard         General transfer comment: increased time to come to standing, min guard for safety  Ambulation/Gait Ambulation/Gait assistance: Min guard Ambulation Distance (Feet): 60 Feet Assistive device: Hemi-walker Gait Pattern/deviations: Step-to pattern;Decreased stride length     General Gait Details: Min guard for safety, limited by fatigue  Stairs             Wheelchair Mobility    Modified Rankin (Stroke Patients Only)       Balance Overall balance assessment: Needs assistance   Sitting balance-Leahy Scale: Good Sitting balance - Comments: good dynamic balance in chair   Standing balance support: No upper extremity supported;Single extremity supported Standing balance-Leahy Scale: Fair Standing balance comment: reliance on single extremity support, but able to release device for static standing                             Pertinent Vitals/Pain Pain Assessment: 0-10 Pain Score: 4  Pain Location: RLE pain during weight bearing  Pain Descriptors / Indicators: Sharp;Sore Pain Intervention(s): Monitored during session    Home Living Family/patient expects to be discharged to:: Skilled nursing facility (for rehabilitation) Living Arrangements: Other relatives ("granddaughter and great-granddaughter) Available Help at Discharge: Family;Available PRN/intermittently Type of Home: Apartment Home Access: Stairs to enter Entrance Stairs-Rails: Right;Left Entrance Stairs-Number of Steps: 13 Home Layout: One level Home Equipment: Wheelchair - power;Wheelchair - manual;Tub bench;Other (comment) Additional Comments: hemiwalker    Prior Function Level of Independence: Independent with assistive device(s)         Comments: was at rehab center prior to this admission     Hand Dominance   Dominant Hand:  (converted to left dominant)    Extremity/Trunk Assessment   Upper Extremity Assessment Upper Extremity Assessment: RUE deficits/detail RUE Deficits / Details: flexion synergy and basically nonfunctional    Lower Extremity Assessment Lower Extremity Assessment: RLE deficits/detail RLE Deficits / Details: extensor tone/synergy RLE Sensation: history of peripheral neuropathy RLE Coordination: decreased fine motor;decreased gross motor  Communication   Communication: No difficulties  Cognition  Arousal/Alertness: Awake/alert Behavior During Therapy: WFL for tasks assessed/performed Overall Cognitive Status: Within Functional Limits for tasks assessed                                        General Comments      Exercises     Assessment/Plan    PT Assessment Patient needs continued PT services  PT Problem List Decreased activity tolerance;Pain;Decreased mobility;Cardiopulmonary status limiting activity       PT Treatment Interventions Gait training;DME instruction;Therapeutic activities;Therapeutic exercise;Patient/family education;Stair training;Balance training;Functional mobility training    PT Goals (Current goals can be found in the Care Plan section)  Acute Rehab PT Goals Patient Stated Goal: To go home PT Goal Formulation: With patient Time For Goal Achievement: 12/18/16 Potential to Achieve Goals: Good    Frequency Min 3X/week   Barriers to discharge        Co-evaluation               AM-PAC PT "6 Clicks" Daily Activity  Outcome Measure Difficulty turning over in bed (including adjusting bedclothes, sheets and blankets)?: A Lot Difficulty moving from lying on back to sitting on the side of the bed? : Total Difficulty sitting down on and standing up from a chair with arms (e.g., wheelchair, bedside commode, etc,.)?: Total Help needed moving to and from a bed to chair (including a wheelchair)?: A Little Help needed walking in hospital room?: A Little Help needed climbing 3-5 steps with a railing? : A Little 6 Click Score: 13    End of Session Equipment Utilized During Treatment: Gait belt Activity Tolerance: Patient tolerated treatment well Patient left: in bed;with call bell/phone within reach Nurse Communication: Patient requests pain meds PT Visit Diagnosis: Difficulty in walking, not elsewhere classified (R26.2);Pain Pain - Right/Left: Right Pain - part of body: Ankle and joints of foot    Time: 1020-1038 PT Time  Calculation (min) (ACUTE ONLY): 18 min   Charges:   PT Evaluation $PT Eval Moderate Complexity: 1 Procedure     PT G Codes:        Alben Deeds, PT DPT [facility] [phone]   Duncan Dull 12/04/2016, 11:12 AM

## 2016-12-04 NOTE — Progress Notes (Signed)
PROGRESS NOTE  Tina Dyer ZSW:[id number] DOB: 14-Jul-1940 DOA: 11/28/2016 PCP: Vicenta Aly, FNP   LOS: 6 days   Brief Narrative / Interim history: 76 y.o.femalewith medical history significant of osteoarthritis, unspecified CHF, chronic leg pain, COPD, CAD with history of MI in 2001 with no intervention, cardiac cath with stent placement in 2002, urolithiasis, previous CVA with right-sided deficit, hyperlipidemia, hypertension, insomnia, pulmonary nodules, type 2 diabetes,S/P RIGHT robotic nephroureterectomy on 6/22/18by Dr. Hoy Register is brought via EMS from [place] home after the staff noticed that she was dyspneic.  She was admitted to the hospital with COPD exacerbation.  While hospitalized, she has been complaining of right foot pain, and evaluation showed limb ischemia.  Vascular surgery consulted.  Assessment & Plan: Principal Problem:   Acute respiratory failure with hypoxia (HCC) Active Problems:   Type 2 diabetes mellitus (HCC)   Hyperlipidemia, mixed   Coronary artery disease   Pulmonary nodules   History of stroke with current residual effects   Hypertension   COPD (chronic obstructive pulmonary disease) (HCC)   Abdominal pain   Elevated lipase   Hyperkalemia   AKI (acute kidney injury) (HCC)   Restless leg syndrome    Acute COPD exacerbation /acute respiratory failure with hypoxia -BNP was within normal limits, likely worsening COPD exacerbation -VQ scan done on admission showed low probability for PE -Patient was started empirically on antibiotics with doxycycline, today day 5/7 -Continue inhalers, nebulizers, prednisone, respiratory status improving and patient is no longer wheezing  Peripheral arterial disease -Patient with right foot pain, ABI report to severe arterial insufficiency at rest, vascular surgery consulted and patient is status post aortogram/right leg runoff on 7/10.  Awaiting further recommendations by vascular surgery.  Based on  the results, patient's anatomy is compatible with a common femoral artery to below the knee popliteal bypass  NSVT  -for 6 beats, resolved, likely due to COPD exacerbation.  2D echo showed EF of 55%.  Bradycardia -Asymptomatic.  Blood pressure stable.  Hyperkalemia -Resolved and within normal limits on last check  Type 2 diabetes mellitus (HCC) -CBGs slightly worse due to steroids, continue sliding scale  Hyperlipidemia, mixed - Stable on on lipitor.   Coronary artery disease -no chest pain reported  Pulmonary nodules - as per radiology recommendations patient will need repeat imaging:  Non-contrast chest CT at 3-6 months is recommended  History of stroke with current residual effects -Statin and secondary stroke prevention  Hypertension -Well controlled on amlodipine will continue.  Abdominal pain -CT scan of abdomen reported no acute findings in abdomen and pelvis  Elevated lipase -normalized on last check  AKI (acute kidney injury) ([place]) -stable, Cr improving. Suspect underlying CKD given PAD / DM  Restless leg syndrome   DVT prophylaxis: heparin Code Status: Full code Family Communication: no family at bedside Disposition Plan: SNF when cleared by vascular   Consultants:   Vascular surgery    Procedures:   Echo reporting EF of 55 % with grade 1 DD, VQ scan negative  Antimicrobials:  Doxycycline 5/7   Subjective: Breathing is better, no chest pain, continues with mild RLE pain  Objective: Vitals:   12/03/16 1942 12/03/16 2251 12/04/16 0600 12/04/16 0900  BP: (!) 130/54  (!) 137/55   Pulse: 74  63   Resp: 13     Temp: 97.9 F (36.6 C)  98.2 F (36.8 C)   TempSrc: Oral  Oral   SpO2: 94% 95% 93% 95%  Weight:   65 kg (143 lb  3.2 oz)   Height:        Intake/Output Summary (Last 24 hours) at 12/04/16 1158 Last data filed at 12/04/16 0900  Gross per 24 hour  Intake              500 ml  Output             1300 ml  Net              -800 ml   Filed Weights   12/01/16 2121 12/02/16 2240 12/04/16 0600  Weight: 67.7 kg (149 lb 4 oz) 66.2 kg (146 lb) 65 kg (143 lb 3.2 oz)    Examination:  Vitals:   12/03/16 1942 12/03/16 2251 12/04/16 0600 12/04/16 0900  BP: (!) 130/54  (!) 137/55   Pulse: 74  63   Resp: 13     Temp: 97.9 F (36.6 C)  98.2 F (36.8 C)   TempSrc: Oral  Oral   SpO2: 94% 95% 93% 95%  Weight:   65 kg (143 lb 3.2 oz)   Height:        Constitutional: NAD Eyes: lids and conjunctivae normal Respiratory: clear to auscultation bilaterally, no wheezing, no crackles. Normal respiratory effort. No accessory muscle use. Overall decreased breath sounds  Cardiovascular: Regular rate and rhythm, no murmurs / rubs / gallops.  Abdomen: no tenderness. Bowel sounds positive.  Skin: no rashes, lesions, ulcers. No induration Neurologic: non focal    Data Reviewed: I have independently reviewed following labs and imaging studies  CBC:  Recent Labs Lab 11/28/16 0055 11/29/16 0334 12/03/16 0354 12/03/16 1011  WBC 8.1 6.9 6.5 6.0  NEUTROABS 4.9  --   --   --   HGB 10.1* 9.5* 10.0* 11.5*  HCT 32.0* 29.9* 31.3* 35.7*  MCV 90.7 91.2 88.4 88.8  PLT 359 330 401* 536   Basic Metabolic Panel:  Recent Labs Lab 11/28/16 0055 11/28/16 0343 11/29/16 0334 11/30/16 0412 12/01/16 0904 12/03/16 0354 12/03/16 1011  NA 136  --  137 135 137 137  --   K 5.5* 5.1 5.5* 4.7 4.5 4.8  --   CL 103  --  104 102 103 102  --   CO2 26  --  26 22 25 25   --   GLUCOSE 179*  --  88 307* 231* 180*  --   BUN 26*  --  25* 38* 41* 43*  --   CREATININE 1.89*  --  1.72* 1.90* 1.69* 1.61* 1.48*  CALCIUM 9.0  --  8.9 9.0 9.3 9.4  --   MG  --   --   --   --  1.8  --   --   PHOS  --   --   --   --  3.0  --   --    GFR: Estimated Creatinine Clearance: 30 mL/min (A) (by C-G formula based on SCr of 1.48 mg/dL (H)). Liver Function Tests:  Recent Labs Lab 11/28/16 0055 11/30/16 0412  AST 20 17  ALT 15 14  ALKPHOS 86 101    BILITOT 0.8 0.7  PROT 6.3* 6.3*  ALBUMIN 3.1* 3.1*    Recent Labs Lab 11/28/16 0055 11/29/16 1430  LIPASE 84* 29   No results for input(s): AMMONIA in the last 168 hours. Coagulation Profile: No results for input(s): INR, PROTIME in the last 168 hours. Cardiac Enzymes: No results for input(s): CKTOTAL, CKMB, CKMBINDEX, TROPONINI in the last 168 hours. BNP (last 3 results) No results for  input(s): PROBNP in the last 8760 hours. HbA1C: No results for input(s): HGBA1C in the last 72 hours. CBG:  Recent Labs Lab 12/03/16 1004 12/03/16 1617 12/03/16 2121 12/04/16 0757 12/04/16 1126  GLUCAP 115* 165* 210* 112* 292*   Lipid Profile: No results for input(s): CHOL, HDL, LDLCALC, TRIG, CHOLHDL, LDLDIRECT in the last 72 hours. Thyroid Function Tests: No results for input(s): TSH, T4TOTAL, FREET4, T3FREE, THYROIDAB in the last 72 hours. Anemia Panel: No results for input(s): VITAMINB12, FOLATE, FERRITIN, TIBC, IRON, RETICCTPCT in the last 72 hours. Urine analysis:    Component Value Date/Time   COLORURINE YELLOW 11/28/2016 0335   APPEARANCEUR CLEAR 11/28/2016 0335   LABSPEC 1.018 11/28/2016 0335   PHURINE 5.0 11/28/2016 0335   GLUCOSEU NEGATIVE 11/28/2016 0335   HGBUR NEGATIVE 11/28/2016 0335   BILIRUBINUR NEGATIVE 11/28/2016 0335   KETONESUR NEGATIVE 11/28/2016 0335   PROTEINUR 30 (A) 11/28/2016 0335   NITRITE NEGATIVE 11/28/2016 0335   LEUKOCYTESUR NEGATIVE 11/28/2016 0335   Sepsis Labs: Invalid input(s): PROCALCITONIN, LACTICIDVEN  Recent Results (from the past 240 hour(s))  MRSA PCR Screening     Status: None   Collection Time: 11/28/16  2:26 PM  Result Value Ref Range Status   MRSA by PCR NEGATIVE NEGATIVE Final    Comment:        The GeneXpert MRSA Assay (FDA approved for NASAL specimens only), is one component of a comprehensive MRSA colonization surveillance program. It is not intended to diagnose MRSA infection nor to guide or monitor treatment  for MRSA infections.       Radiology Studies: No results found.   Scheduled Meds: . amLODipine  5 mg Oral Daily  . atorvastatin  80 mg Oral QHS  . chlorhexidine  15 mL Mouth Rinse BID  . citalopram  10 mg Oral Daily  . doxycycline  100 mg Oral Q12H  . gabapentin  300 mg Oral TID WC  . gabapentin  900 mg Oral QHS  . heparin subcutaneous  5,000 Units Subcutaneous Q8H  . hydrALAZINE  5 mg Intravenous Once  . insulin aspart  0-9 Units Subcutaneous TID WC  . mouth rinse  15 mL Mouth Rinse q12n4p  . mometasone-formoterol  2 puff Inhalation BID  . pantoprazole  40 mg Oral Daily  . predniSONE  50 mg Oral Q breakfast  . rOPINIRole  0.25 mg Oral Q24H  . rOPINIRole  0.5 mg Oral 2 times per day  . umeclidinium bromide  1 puff Inhalation Daily   Continuous Infusions:  Marzetta Board, MD, PhD [facility] Pager [phone]  If 7PM-7AM, please contact night-coverage [email or web address] Password [facility] 12/04/2016, 11:58 AM

## 2016-12-04 NOTE — Care Management Important Message (Signed)
Important Message  Patient Details  Name: Tina Dyer MRN: [id number] Date of Birth: 03/10/1941   Medicare Important Message Given:  Yes    Orbie Pyo 12/04/2016, 1:39 PM

## 2016-12-04 NOTE — Progress Notes (Signed)
Patient ID: Tina Dyer, female   DOB: 06/10/1940, 76 y.o.   MRN: [id number] Resting comfortably. Left and right foot equally warm. Very difficult to arouse. Reviewed arteriogram from yesterday. No evidence of acute changes of ischemia. Chronic total SFA occlusion. Would require right femoral to below-knee popliteal bypass for revascularization.  Difficult management decisions related to her old stroke and right-sided weakness and chronic right-sided pain. Unclear as to whether her SFA occlusive disease has any bearing on her current level of pain. Would favor observation only initially. No evidence of acute limb threatening ischemia. Recommend discharge to nursing home as planned when stable and I will see her in the office for continued discussion and follow-up.

## 2016-12-05 LAB — GLUCOSE, CAPILLARY
Glucose-Capillary: 158 mg/dL — ABNORMAL HIGH (ref 65–99)
Glucose-Capillary: 183 mg/dL — ABNORMAL HIGH (ref 65–99)

## 2016-12-05 MED ORDER — PREDNISONE 10 MG PO TABS: 30.0000 mg | ORAL_TABLET | Freq: Every day | ORAL | Status: AC

## 2016-12-05 MED ORDER — DOXYCYCLINE HYCLATE 100 MG PO TABS: 100.0000 mg | ORAL_TABLET | Freq: Two times a day (BID) | ORAL | Status: AC

## 2016-12-05 MED ORDER — PREDNISONE 20 MG PO TABS
30.0000 mg | ORAL_TABLET | Freq: Every day | ORAL | Status: DC
  Administered 2016-12-05: 30 mg via ORAL
  Filled 2016-12-05: qty 1

## 2016-12-05 NOTE — Progress Notes (Signed)
Patient will discharge to [facility] Anticipated discharge date: 12/05/16 Family notified: [name], granddaughter Transportation by: [facility]   CSW signing off.  Estanislado Emms, [place]  Clinical Social Worker

## 2016-12-05 NOTE — Discharge Summary (Signed)
Physician Discharge Summary  RHEANNA Dyer RCV:[id number] DOB: October 27, 1940 DOA: 11/28/2016  PCP: Vicenta Aly, FNP  Admit date: 11/28/2016 Discharge date: 12/05/2016  Admitted From: SNF Disposition:  SNF  Recommendations for Outpatient Follow-up:  1. Follow up with PCP in 1-2 weeks 2. Follow up with Vascular surgery in 2 weeks 3. Continue Prednisone taper 30 mg daily for 2 days then 20 mg daily for 2 days then 10 mg daily for 2 days 4. Continue Doxycycline for 2 more days  Discharge Condition: stable CODE STATUS: Full code Diet recommendation: heart healthy  HPI: Per Dr. Olevia Bowens, Tina Dyer is a 76 y.o. female with medical history significant of osteoarthritis, unspecified CHF, chronic leg pain, COPD, CAD with history of MI in 2001 with no intervention, cardiac cath with stent placement in 2002, urolithiasis, previous CVA with right-sided deficit, hyperlipidemia, hypertension, insomnia, pulmonary nodules, type 2 diabetes,S/P RIGHT robotic nephroureterectomy on 11/15/16 by Dr. Tresa Moore who is brought via EMS from [facility] after the staff noticed that she was dyspneic with wheezing rhonchi and rales since the afternoon. In route to the hospital, she received albuterol 5 mg via nebulizer, sublingual nitroglycerin and was placed on CPAP by EMS. Per patient, she had epigastric abdominal pain yesterday in the afternoon after lunch. She subsequently became nauseous and vomited twice, which did not change in quality and intensity of pain. She denies diarrhea, melena, constipation or hematochezia. She denies dysuria, frequency, hematuria or oliguria. Then later in the evening, the patient developed low dyspnea associated with rhonchi, wheezing or rales. She denies productive cough, chest pain, dizziness, diaphoresis or palpitations. She complains of recent lower extremity edema, particularly on the right side. ED Course: She was put on BiPAP ventilation. Her initial vital signs were  temperature 36.90F, pulse 109, blood pressure 102/59 mmHg, respirations 21 and O2 sat of 100% on BiPAP. She was subsequently weaned to [place] oxygen. She was given fentanyl 25 g IVP 1 dose. Her EKG shows sinus tachycardia with borderline repolarization abnormality, troponin and BNP level are normal. Urinalysis show mild proteinuria at 30 mg/dL, but was otherwise unremarkable WBC 8.1, hemoglobin 10.1 g/dL and platelets 359. Her lipase was 84. Her sodium was 136, potassium 5.5 (follow-up level 5.1), chloride 103 and bicarbonate 26 mmol/L. BUN was 26, creatinine 1.89 and glucose 179 mg/dL. Her albumin was 3.1 g/dL.  Hospital Course: Discharge Diagnoses:  Principal Problem:   Acute respiratory failure with hypoxia (HCC) Active Problems:   Type 2 diabetes mellitus (HCC)   Hyperlipidemia, mixed   Coronary artery disease   Pulmonary nodules   History of stroke with current residual effects   Hypertension   COPD (chronic obstructive pulmonary disease) (HCC)   Abdominal pain   Elevated lipase   Hyperkalemia   AKI (acute kidney injury) ([place])   Restless leg syndrome   Acute COPD exacerbation /acute respiratory failure with hypoxia -BNP was within normal limits, likely worsening COPD exacerbation. VQ scan done on admission showed low probability for PE. Patient was started empirically on antibiotics with doxycycline, nebulizers and steroids. Her respiratory status improved and she is now back to baseline, on room air. Continue doxycyline for 2 additional days and a prednisone taper as above Peripheral arterial disease -Patient with right foot pain, ABI report to severe arterial insufficiency at rest, vascular surgery consulted and patient is status post aortogram/right leg runoff on 7/10. Based on the results, patient's anatomy is compatible with a common femoral artery to below the knee popliteal bypass. Vascular  surgery does not recommend any acute interventions and will follow up with patient in  office NSVT  -for 6 beats, resolved, likely due to COPD exacerbation.  2D echo showed EF of 55%. Bradycardia -Asymptomatic.  Blood pressure stable. Hyperkalemia -Resolved and within normal limits on last check Type 2 diabetes mellitus (HCC) -CBGs slightly worse due to steroids, continue sliding scale Hyperlipidemia, mixed - Stable on on lipitor.  Coronary artery disease -no chest pain reported Pulmonary nodules - as per radiology recommendations patient will need repeat imaging:  Non-contrast chest CT at 3-6 months is recommended History of stroke with current residual effects -Statin and secondary stroke prevention Hypertension -Well controlled on amlodipine will continue. Stop Lisinopril due to AKI Abdominal pain -CT scan of abdomen reported no acute findings in abdomen and pelvis Elevated lipase -normalized on last check AKI (acute kidney injury) ([place]) -stable, Cr improving. Suspect underlying CKD given PAD / DM. Will hold lisinopril on d/c, recommend repeat BMP in 1 week and to consider resumption then if renal function is stable.  Restless leg syndrome - continue home medications   Discharge Instructions   Allergies as of 12/05/2016      Reactions   Sulfa Antibiotics Hives   blisters   Codeine Hives   Latex Hives   Penicillins Hives   Has patient had a PCN reaction causing immediate rash, facial/tongue/throat swelling, SOB or lightheadedness with hypotension: No Has patient had a PCN reaction causing severe rash involving mucus membranes or skin necrosis: No Has patient had a PCN reaction that required hospitalization No Has patient had a PCN reaction occurring within the last 10 years: No If all of the above answers are "NO", then may proceed with Cephalosporin use.      Medication List    STOP taking these medications   lisinopril 10 MG tablet Commonly known as:  PRINIVIL,ZESTRIL     TAKE these medications   amLODipine 5 MG tablet Commonly known as:  NORVASC Take 1  tablet by mouth daily.   atorvastatin 80 MG tablet Commonly known as:  LIPITOR Take 80 mg by mouth at bedtime.   budesonide-formoterol 160-4.5 MCG/ACT inhaler Commonly known as:  SYMBICORT Inhale 2 puffs into the lungs 2 (two) times daily.   citalopram 10 MG tablet Commonly known as:  CELEXA Take 10 mg by mouth daily.   doxycycline 100 MG tablet Commonly known as:  VIBRA-TABS Take 1 tablet (100 mg total) by mouth every 12 (twelve) hours. For 2 more days   gabapentin 300 MG capsule Commonly known as:  NEURONTIN Take 300-900 mg by mouth 4 (four) times daily. Pt takes 300 mg during the day three times and 900 mg in PM   INCRUSE ELLIPTA 62.5 MCG/INH Aepb Generic drug:  umeclidinium bromide Inhale 1 puff into the lungs daily.   metFORMIN 1000 MG tablet Commonly known as:  GLUCOPHAGE Take 1,000 mg by mouth daily.   omeprazole 20 MG capsule Commonly known as:  PRILOSEC Take 20 mg by mouth daily.   ondansetron 4 MG disintegrating tablet Commonly known as:  ZOFRAN ODT Take 1 tablet (4 mg total) by mouth every 8 (eight) hours as needed for nausea or vomiting.   predniSONE 10 MG tablet Commonly known as:  DELTASONE Take 3 tablets (30 mg total) by mouth daily with breakfast. 30 mg daily for 2 days then 20 mg daily for 2 days then 10 mg daily for 2 days   rOPINIRole 0.25 MG tablet Commonly known as:  REQUIP Take  0.25-0.5 mg by mouth 3 (three) times daily. Take 0.25 at 1500. Take 0.5 at 1900 and 2200   traMADol 50 MG tablet Commonly known as:  ULTRAM Take 1-2 tablets (50-100 mg total) by mouth every 6 (six) hours as needed for moderate pain or severe pain.   VENTOLIN HFA 108 (90 Base) MCG/ACT inhaler Generic drug:  albuterol Inhale 2 puffs into the lungs every 4 (four) hours as needed for wheezing or shortness of breath.      Follow-up Information    Conrad Godley, MD. Schedule an appointment as soon as possible for a visit in 3 week(s).   Specialties:  Vascular Surgery,  Cardiology Contact information: [address] [place] [address] [phone]          Allergies  Allergen Reactions  . Sulfa Antibiotics Hives    blisters  . Codeine Hives  . Latex Hives  . Penicillins Hives    Has patient had a PCN reaction causing immediate rash, facial/tongue/throat swelling, SOB or lightheadedness with hypotension: No Has patient had a PCN reaction causing severe rash involving mucus membranes or skin necrosis: No Has patient had a PCN reaction that required hospitalization No Has patient had a PCN reaction occurring within the last 10 years: No If all of the above answers are "NO", then may proceed with Cephalosporin use.      Consultations:  Vascular surgery   Procedures/Studies:  2D echo  Study Conclusions - Left ventricle: LVEF is 55% with basal inferior/inferoseptal akinesis. The cavity size was normal. Wall thickness was normal. Doppler parameters are consistent with abnormal left ventricular relaxation (grade 1 diastolic dysfunction).  Peripheral vascular catheterization   Ct Abdomen Pelvis Wo Contrast  Result Date: 11/28/2016 CLINICAL DATA:  Shortness of breath and abdominal pain EXAM: CT ABDOMEN AND PELVIS WITHOUT CONTRAST TECHNIQUE: Multidetector CT imaging of the abdomen and pelvis was performed following the standard protocol without IV contrast. COMPARISON:  CT abdomen pelvis 09/21/2016 FINDINGS: Examination is degraded by motion. Lower chest: There are multiple nodular opacities of the lung bases, including a 8 mm nodule in the lingula and in unchanged 9 mm nodule in the left lower lobe. Hepatobiliary: Normal hepatic size and contours. No perihepatic ascites. No intra- or extrahepatic biliary dilatation. Gallbladder is surgically absent. Pancreas: Normal pancreatic contours. No peripancreatic fluid collection or pancreatic ductal dilatation. Spleen: Normal. Adrenals/Urinary Tract: Normal adrenal glands. Status post right nephrectomy.  Normal left kidney. Stomach/Bowel: There is no hiatal hernia. The stomach and duodenum are normal. There is no dilated small bowel or enteric inflammation. There is no colonic abnormality. The appendix is normal. Vascular/Lymphatic: There is atherosclerotic calcification of the non aneurysmal abdominal aorta. No abdominal or pelvic adenopathy. Reproductive: Status post hysterectomy.  No adnexal mass. Musculoskeletal: Multilevel lumbar degenerative means without bony spinal canal stenosis. Normal visualized extrathoracic and extraperitoneal soft tissues. Other: There is subcutaneous emphysema within the anterior abdominal wall, compatible with recent abdominal surgery. IMPRESSION: 1. Status post recent right nephrectomy with expected postsurgical findings. 2. No acute abdominal or pelvic abnormality. 3.  Aortic Atherosclerosis (ICD10-I70.0). 4. Multiple basilar pulmonary nodules measuring up to 9 mm. Non-contrast chest CT at 3-6 months is recommended. If the nodules are stable at time of repeat CT, then future CT at 18-24 months (from today's scan) is considered optional for low-risk patients, but is recommended for high-risk patients. This recommendation follows the consensus statement: Guidelines for Management of Incidental Pulmonary Nodules Detected on CT Images: From the Fleischner Society 2017; Radiology 2017;  235:361-443. Electronically Signed   By: Ulyses Jarred M.D.   On: 11/28/2016 04:43   Dg Chest 2 View  Result Date: 11/29/2016 CLINICAL DATA:  Shortness of breath with cough and wheezing EXAM: CHEST  2 VIEW COMPARISON:  November 28, 2016 FINDINGS: There is stable mild right base atelectasis with elevation of the right hemidiaphragm. There is no edema or consolidation. Heart size and pulmonary vascularity are normal. No adenopathy. There is aortic atherosclerosis. There are no evident bone lesions. IMPRESSION: Mild right base atelectasis. No edema or consolidation. There is aortic atherosclerosis. Aortic  Atherosclerosis (ICD10-I70.0). Electronically Signed   By: Lowella Grip III M.D.   On: 11/29/2016 17:25   Nm Pulmonary Perf And Vent  Result Date: 11/29/2016 CLINICAL DATA:  Respiratory failure and hypoxia. EXAM: NUCLEAR MEDICINE VENTILATION - PERFUSION LUNG SCAN TECHNIQUE: Ventilation images were obtained in multiple projections using inhaled aerosol Tc-24m DTPA. Perfusion images were obtained in multiple projections after intravenous injection of Tc-63m MAA. RADIOPHARMACEUTICALS:  31.3 mCi Technetium-47m DTPA aerosol inhalation and 4.2 mCi Technetium-32m MAA IV COMPARISON:  Current chest radiograph. FINDINGS: Ventilation: There is patchy bilateral lung ventilation similar in both lungs. Perfusion: There are nonsegmental areas of relative decreased profusion in the lungs,. There are no wedge-shaped/segmental areas of decreased profusion to suggest pulmonary thromboembolism. IMPRESSION: No evidence of a pulmonary thromboembolism. Patchy areas of decreased ventilation and lesser degrees of nonsegmental areas of relative decreased profusion are likely on the basis of airways disease. Electronically Signed   By: Lajean Manes M.D.   On: 11/29/2016 17:28   Dg Chest Port 1 View  Result Date: 11/28/2016 CLINICAL DATA:  Acute onset of shortness of breath and wheezing. Initial encounter. EXAM: PORTABLE CHEST 1 VIEW COMPARISON:  Chest radiograph performed 10/18/2015 FINDINGS: There is elevation of the right hemidiaphragm, with mild right basilar atelectasis. No pleural effusion or pneumothorax is seen The cardiomediastinal silhouette is normal in size. No acute osseous abnormalities are identified. IMPRESSION: Elevation of the right hemidiaphragm, with mild right basilar atelectasis. Electronically Signed   By: Garald Balding M.D.   On: 11/28/2016 01:13      Subjective: - no chest pain, shortness of breath, no abdominal pain, nausea or vomiting.   Discharge Exam: Vitals:   12/04/16 2112 12/05/16 0445    BP: (!) 149/70 (!) 163/68  Pulse: 82 82  Resp: 15 19  Temp: 98.2 F (36.8 C) 98.3 F (36.8 C)   Vitals:   12/04/16 1424 12/04/16 2112 12/05/16 0445 12/05/16 0847  BP:  (!) 149/70 (!) 163/68   Pulse:  82 82   Resp:  15 19   Temp:  98.2 F (36.8 C) 98.3 F (36.8 C)   TempSrc:  Oral Oral   SpO2: 98% 95% 95% 94%  Weight:   65.1 kg (143 lb 8 oz)   Height:        General: Pt is alert, awake, not in acute distress Cardiovascular: RRR, S1/S2 +, no rubs, no gallops Respiratory: CTA bilaterally, no wheezing, no rhonchi Abdominal: Soft, NT, ND, bowel sounds + Extremities: no edema, no cyanosis    The results of significant diagnostics from this hospitalization (including imaging, microbiology, ancillary and laboratory) are listed below for reference.     Microbiology: Recent Results (from the past 240 hour(s))  MRSA PCR Screening     Status: None   Collection Time: 11/28/16  2:26 PM  Result Value Ref Range Status   MRSA by PCR NEGATIVE NEGATIVE Final    Comment:  The GeneXpert MRSA Assay (FDA approved for NASAL specimens only), is one component of a comprehensive MRSA colonization surveillance program. It is not intended to diagnose MRSA infection nor to guide or monitor treatment for MRSA infections.      Labs: BNP (last 3 results)  Recent Labs  11/28/16 0053 11/28/16 1832  BNP 37.3 16.1   Basic Metabolic Panel:  Recent Labs Lab 11/29/16 0334 11/30/16 0412 12/01/16 0904 12/03/16 0354 12/03/16 1011  NA 137 135 137 137  --   K 5.5* 4.7 4.5 4.8  --   CL 104 102 103 102  --   CO2 26 22 25 25   --   GLUCOSE 88 307* 231* 180*  --   BUN 25* 38* 41* 43*  --   CREATININE 1.72* 1.90* 1.69* 1.61* 1.48*  CALCIUM 8.9 9.0 9.3 9.4  --   MG  --   --  1.8  --   --   PHOS  --   --  3.0  --   --    Liver Function Tests:  Recent Labs Lab 11/30/16 0412  AST 17  ALT 14  ALKPHOS 101  BILITOT 0.7  PROT 6.3*  ALBUMIN 3.1*    Recent Labs Lab  11/29/16 1430  LIPASE 29   No results for input(s): AMMONIA in the last 168 hours. CBC:  Recent Labs Lab 11/29/16 0334 12/03/16 0354 12/03/16 1011  WBC 6.9 6.5 6.0  HGB 9.5* 10.0* 11.5*  HCT 29.9* 31.3* 35.7*  MCV 91.2 88.4 88.8  PLT 330 401* 288   Cardiac Enzymes: No results for input(s): CKTOTAL, CKMB, CKMBINDEX, TROPONINI in the last 168 hours. BNP: Invalid input(s): POCBNP CBG:  Recent Labs Lab 12/04/16 0757 12/04/16 1126 12/04/16 1655 12/04/16 2110 12/05/16 0838  GLUCAP 112* 292* 256* 381* 158*   D-Dimer No results for input(s): DDIMER in the last 72 hours. Hgb A1c No results for input(s): HGBA1C in the last 72 hours. Lipid Profile No results for input(s): CHOL, HDL, LDLCALC, TRIG, CHOLHDL, LDLDIRECT in the last 72 hours. Thyroid function studies No results for input(s): TSH, T4TOTAL, T3FREE, THYROIDAB in the last 72 hours.  Invalid input(s): FREET3 Anemia work up No results for input(s): VITAMINB12, FOLATE, FERRITIN, TIBC, IRON, RETICCTPCT in the last 72 hours. Urinalysis    Component Value Date/Time   COLORURINE YELLOW 11/28/2016 0335   APPEARANCEUR CLEAR 11/28/2016 0335   LABSPEC 1.018 11/28/2016 0335   PHURINE 5.0 11/28/2016 0335   GLUCOSEU NEGATIVE 11/28/2016 0335   HGBUR NEGATIVE 11/28/2016 0335   BILIRUBINUR NEGATIVE 11/28/2016 0335   KETONESUR NEGATIVE 11/28/2016 0335   PROTEINUR 30 (A) 11/28/2016 0335   NITRITE NEGATIVE 11/28/2016 0335   LEUKOCYTESUR NEGATIVE 11/28/2016 0335   Sepsis Labs Invalid input(s): PROCALCITONIN,  WBC,  LACTICIDVEN Microbiology Recent Results (from the past 240 hour(s))  MRSA PCR Screening     Status: None   Collection Time: 11/28/16  2:26 PM  Result Value Ref Range Status   MRSA by PCR NEGATIVE NEGATIVE Final    Comment:        The GeneXpert MRSA Assay (FDA approved for NASAL specimens only), is one component of a comprehensive MRSA colonization surveillance program. It is not intended to diagnose  MRSA infection nor to guide or monitor treatment for MRSA infections.      Time coordinating discharge: 40 minutes  SIGNED:  Marzetta Board, MD  [facility] 12/05/2016, 9:24 AM Pager [phone]  If 7PM-7AM, please contact night-coverage [email or web address] Password [email or web address]

## 2016-12-05 NOTE — Clinical Social Work Placement (Signed)
   CLINICAL SOCIAL WORK PLACEMENT  NOTE  Date:  12/05/2016  Patient Details  Name: Tina Dyer MRN: [id number] Date of Birth: 1940/09/04  Clinical Social Work is seeking post-discharge placement for this patient at the [place] level of care (*CSW will initial, date and re-position this form in  chart as items are completed):  Yes   Patient/family provided with [facility] Work Department's list of facilities offering this level of care within the geographic area requested by the patient (or if unable, by the patient's family).  Yes   Patient/family informed of their freedom to choose among providers that offer the needed level of care, that participate in Medicare, Medicaid or managed care program needed by the patient, have an available bed and are willing to accept the patient.  Yes   Patient/family informed of [facility]'s ownership interest in [facility] and [facility], as well as of the fact that they are under no obligation to receive care at these facilities.  PASRR submitted to EDS on       PASRR number received on       Existing PASRR number confirmed on 12/02/16     FL2 transmitted to all facilities in geographic area requested by pt/family on 12/03/16     FL2 transmitted to all facilities within larger geographic area on       Patient informed that his/her managed care company has contracts with or will negotiate with certain facilities, including the following:  [facility]     Yes   Patient/family informed of bed offers received.  Patient chooses bed at [facility]     Physician recommends and patient chooses bed at      Patient to be transferred to [facility] on 12/05/16.  Patient to be transferred to facility by [facility]     Patient family notified on 12/05/16 of transfer.  Name of family member notified:  [name], granddaughter     PHYSICIAN        Additional Comment:    _______________________________________________ Estanislado Emms, LCSW 12/05/2016, 10:08 AM

## 2016-12-05 NOTE — Care Management Note (Signed)
Case Management Note  Patient Details  Name: HOLLI RENGEL MRN: [id number] Date of Birth: 08/05/1940  Subjective/Objective: Pt presented as a transfer from [facility] for Acute Respiratory Failure with Hypoxia. Plan will be to return to [facility] 12-05-16.                    Action/Plan: CSW assisting with disposition needs. No further needs from CM at this time.   Expected Discharge Date:  12/05/16               Expected Discharge Plan:  [place]  In-House Referral:  Clinical Social Work  Discharge planning Services  CM Consult  Post Acute Care Choice:  NA Choice offered to:  NA  DME Arranged:  N/A DME Agency:  NA  HH Arranged:  NA HH Agency:  NA  Status of Service:  Completed, signed off  If discussed at [employer] of Stay Meetings, dates discussed:  12-05-16  Additional Comments:  Bethena Roys, RN 12/05/2016, 11:39 AM

## 2016-12-05 NOTE — Discharge Instructions (Signed)
Follow with Vicenta Aly, FNP in 5-7 days  Please get a complete blood count and chemistry panel checked by your Primary MD at your next visit, and again as instructed by your Primary MD. Please get your medications reviewed and adjusted by your Primary MD.  Please request your Primary MD to go over all Hospital Tests and Procedure/Radiological results at the follow up, please get all Hospital records sent to your Prim MD by signing hospital release before you go home.  If you had Pneumonia of Lung problems at the Hospital: Please get a 2 view Chest X ray done in 6-8 weeks after hospital discharge or sooner if instructed by your Primary MD.  If you have Congestive Heart Failure: Please call your Cardiologist or Primary MD anytime you have any of the following symptoms:  1) 3 pound weight gain in 24 hours or 5 pounds in 1 week  2) shortness of breath, with or without a dry hacking cough  3) swelling in the hands, feet or stomach  4) if you have to sleep on extra pillows at night in order to breathe  Follow cardiac low salt diet and 1.5 lit/day fluid restriction.  If you have diabetes Accuchecks 4 times/day, Once in AM empty stomach and then before each meal. Log in all results and show them to your primary doctor at your next visit. If any glucose reading is under 80 or above 300 call your primary MD immediately.  If you have Seizure/Convulsions/Epilepsy: Please do not drive, operate heavy machinery, participate in activities at heights or participate in high speed sports until you have seen by Primary MD or a Neurologist and advised to do so again.  If you had Gastrointestinal Bleeding: Please ask your Primary MD to check a complete blood count within one week of discharge or at your next visit. Your endoscopic/colonoscopic biopsies that are pending at the time of discharge, will also need to followed by your Primary MD.  Get Medicines reviewed and adjusted. Please take all your  medications with you for your next visit with your Primary MD  Please request your Primary MD to go over all hospital tests and procedure/radiological results at the follow up, please ask your Primary MD to get all Hospital records sent to his/her office.  If you experience worsening of your admission symptoms, develop shortness of breath, life threatening emergency, suicidal or homicidal thoughts you must seek medical attention immediately by calling 911 or calling your MD immediately  if symptoms less severe.  You must read complete instructions/literature along with all the possible adverse reactions/side effects for all the Medicines you take and that have been prescribed to you. Take any new Medicines after you have completely understood and accpet all the possible adverse reactions/side effects.   Do not drive or operate heavy machinery when taking Pain medications.   Do not take more than prescribed Pain, Sleep and Anxiety Medications  Special Instructions: If you have smoked or chewed Tobacco  in the last 2 yrs please stop smoking, stop any regular Alcohol  and or any Recreational drug use.  Wear Seat belts while driving.  Please note You were cared for by a hospitalist during your hospital stay. If you have any questions about your discharge medications or the care you received while you were in the hospital after you are discharged, you can call the unit and asked to speak with the hospitalist on call if the hospitalist that took care of you is not available. Once  you are discharged, your primary care physician will handle any further medical issues. Please note that NO REFILLS for any discharge medications will be authorized once you are discharged, as it is imperative that you return to your primary care physician (or establish a relationship with a primary care physician if you do not have one) for your aftercare needs so that they can reassess your need for medications and monitor your  lab values.  You can reach the hospitalist office at phone [phone] or fax [phone]   If you do not have a primary care physician, you can call [phone] for a physician referral.  Activity: As tolerated with Full fall precautions use walker/cane & assistance as needed  Diet: heart healthy  Disposition Home

## 2016-12-05 NOTE — Progress Notes (Signed)
Subjective: Interval History: none.. Awakens easily this morning and responsive. Explained that attempted to talk to her last evening and she was very sedated. She reports no pain in her right foot currently.  Objective: Vital signs in last 24 hours: Temp:  [98.2 F (36.8 C)-98.5 F (36.9 C)] 98.3 F (36.8 C) (07/12 0445) Pulse Rate:  [80-82] 82 (07/12 0445) Resp:  [15-19] 19 (07/12 0445) BP: (149-163)/(68-85) 163/68 (07/12 0445) SpO2:  [94 %-98 %] 94 % (07/12 0847) Weight:  [143 lb 8 oz (65.1 kg)] 143 lb 8 oz (65.1 kg) (07/12 0445)  Intake/Output from previous day: 07/11 0701 - 07/12 0700 In: 500 [P.O.:500] Out: 1450 [Urine:1450] Intake/Output this shift: No intake/output data recorded.  Both feet warm and well perfused. Neurologically at her baseline related to her old stroke affecting her right side.  Lab Results:  Recent Labs  12/03/16 0354 12/03/16 1011  WBC 6.5 6.0  HGB 10.0* 11.5*  HCT 31.3* 35.7*  PLT 401* 288   BMET  Recent Labs  12/03/16 0354 12/03/16 1011  NA 137  --   K 4.8  --   CL 102  --   CO2 25  --   GLUCOSE 180*  --   BUN 43*  --   CREATININE 1.61* 1.48*  CALCIUM 9.4  --     Studies/Results: Ct Abdomen Pelvis Wo Contrast  Result Date: 11/28/2016 CLINICAL DATA:  Shortness of breath and abdominal pain EXAM: CT ABDOMEN AND PELVIS WITHOUT CONTRAST TECHNIQUE: Multidetector CT imaging of the abdomen and pelvis was performed following the standard protocol without IV contrast. COMPARISON:  CT abdomen pelvis 09/21/2016 FINDINGS: Examination is degraded by motion. Lower chest: There are multiple nodular opacities of the lung bases, including a 8 mm nodule in the lingula and in unchanged 9 mm nodule in the left lower lobe. Hepatobiliary: Normal hepatic size and contours. No perihepatic ascites. No intra- or extrahepatic biliary dilatation. Gallbladder is surgically absent. Pancreas: Normal pancreatic contours. No peripancreatic fluid collection or  pancreatic ductal dilatation. Spleen: Normal. Adrenals/Urinary Tract: Normal adrenal glands. Status post right nephrectomy. Normal left kidney. Stomach/Bowel: There is no hiatal hernia. The stomach and duodenum are normal. There is no dilated small bowel or enteric inflammation. There is no colonic abnormality. The appendix is normal. Vascular/Lymphatic: There is atherosclerotic calcification of the non aneurysmal abdominal aorta. No abdominal or pelvic adenopathy. Reproductive: Status post hysterectomy.  No adnexal mass. Musculoskeletal: Multilevel lumbar degenerative means without bony spinal canal stenosis. Normal visualized extrathoracic and extraperitoneal soft tissues. Other: There is subcutaneous emphysema within the anterior abdominal wall, compatible with recent abdominal surgery. IMPRESSION: 1. Status post recent right nephrectomy with expected postsurgical findings. 2. No acute abdominal or pelvic abnormality. 3.  Aortic Atherosclerosis (ICD10-I70.0). 4. Multiple basilar pulmonary nodules measuring up to 9 mm. Non-contrast chest CT at 3-6 months is recommended. If the nodules are stable at time of repeat CT, then future CT at 18-24 months (from today's scan) is considered optional for low-risk patients, but is recommended for high-risk patients. This recommendation follows the consensus statement: Guidelines for Management of Incidental Pulmonary Nodules Detected on CT Images: From the Fleischner Society 2017; Radiology 2017; 284:228-243. Electronically Signed   By: Ulyses Jarred M.D.   On: 11/28/2016 04:43   Dg Chest 2 View  Result Date: 11/29/2016 CLINICAL DATA:  Shortness of breath with cough and wheezing EXAM: CHEST  2 VIEW COMPARISON:  November 28, 2016 FINDINGS: There is stable mild right base atelectasis with elevation of the  right hemidiaphragm. There is no edema or consolidation. Heart size and pulmonary vascularity are normal. No adenopathy. There is aortic atherosclerosis. There are no evident  bone lesions. IMPRESSION: Mild right base atelectasis. No edema or consolidation. There is aortic atherosclerosis. Aortic Atherosclerosis (ICD10-I70.0). Electronically Signed   By: Lowella Grip III M.D.   On: 11/29/2016 17:25   Nm Pulmonary Perf And Vent  Result Date: 11/29/2016 CLINICAL DATA:  Respiratory failure and hypoxia. EXAM: NUCLEAR MEDICINE VENTILATION - PERFUSION LUNG SCAN TECHNIQUE: Ventilation images were obtained in multiple projections using inhaled aerosol Tc-56m DTPA. Perfusion images were obtained in multiple projections after intravenous injection of Tc-29m MAA. RADIOPHARMACEUTICALS:  31.3 mCi Technetium-45m DTPA aerosol inhalation and 4.2 mCi Technetium-22m MAA IV COMPARISON:  Current chest radiograph. FINDINGS: Ventilation: There is patchy bilateral lung ventilation similar in both lungs. Perfusion: There are nonsegmental areas of relative decreased profusion in the lungs,. There are no wedge-shaped/segmental areas of decreased profusion to suggest pulmonary thromboembolism. IMPRESSION: No evidence of a pulmonary thromboembolism. Patchy areas of decreased ventilation and lesser degrees of nonsegmental areas of relative decreased profusion are likely on the basis of airways disease. Electronically Signed   By: Lajean Manes M.D.   On: 11/29/2016 17:28   Dg Chest Port 1 View  Result Date: 11/28/2016 CLINICAL DATA:  Acute onset of shortness of breath and wheezing. Initial encounter. EXAM: PORTABLE CHEST 1 VIEW COMPARISON:  Chest radiograph performed 10/18/2015 FINDINGS: There is elevation of the right hemidiaphragm, with mild right basilar atelectasis. No pleural effusion or pneumothorax is seen The cardiomediastinal silhouette is normal in size. No acute osseous abnormalities are identified. IMPRESSION: Elevation of the right hemidiaphragm, with mild right basilar atelectasis. Electronically Signed   By: Garald Balding M.D.   On: 11/28/2016 01:13   Anti-infectives: Anti-infectives     Start     Dose/Rate Route Frequency Ordered Stop   12/05/16 0000  doxycycline (VIBRA-TABS) 100 MG tablet     100 mg Oral Every 12 hours 12/05/16 0923     11/30/16 1315  doxycycline (VIBRA-TABS) tablet 100 mg     100 mg Oral Every 12 hours 11/30/16 1309        Assessment/Plan: s/p Procedure(s): Abdominal Aortogram w/Lower Extremity (N/A) Discussed the issues regarding her right superficial femoral artery occlusion with the patient. Also had a long discussion with her daughter by telephone this morning. Explained that this does not appear to be any acute change. Would recommend observation only. Would be a candidate for right femoral to below-knee popliteal bypass but certainly would reserve this for evidence of critical limb ischemia. Difficult to know how much of her discomfort is related to her chronic pain and old stroke and how much is related to arterial insufficiency. Offered to see the patient in follow-up in several weeks. She is moving to a different part of the [place] with her daughter. I suggested that she establish vascular surgery follow-up there and if not possible we'll be happy to see her in our office here in [place].   LOS: 7 days   Curt Jews 12/05/2016, 9:36 AM

## 2016-12-05 NOTE — Evaluation (Signed)
Occupational Therapy Evaluation Patient Details Name: Tina Dyer MRN: [id number] DOB: 07-Jun-1940 Today's Date: 12/05/2016    History of Present Illness 76 y.o. female with medical history significant of osteoarthritis, unspecified CHF, chronic leg pain, COPD, CAD with history of MI in 2001 with no intervention, cardiac cath with stent placement in 2002, urolithiasis, previous CVA with right-sided deficit, hyperlipidemia, hypertension, insomnia, pulmonary nodules, type 2 diabetes,S/P RIGHT robotic nephroureterectomy on 11/15/16 by Dr. Tresa Moore who is brought via EMS from [facility] after the staff noticed that she was dyspneic with wheezing rhonchi    Clinical Impression   Pt is performing ADL at a min assist to modified independent level. No acute OT needs. Signing off.    Follow Up Recommendations  SNF    Equipment Recommendations       Recommendations for Other Services       Precautions / Restrictions Precautions Precautions: None Required Braces or Orthoses: Other Brace/Splint Other Brace/Splint: R AFO Restrictions Weight Bearing Restrictions: No      Mobility Bed Mobility               General bed mobility comments: received in chair  Transfers Overall transfer level: Modified independent Equipment used: Hemi-walker                  Balance Overall balance assessment: Modified Independent   Sitting balance-Leahy Scale: Good       Standing balance-Leahy Scale: Good Standing balance comment: can stand without support at sink during ADL                           ADL either performed or assessed with clinical judgement   ADL Overall ADL's : Modified independent                                       General ADL Comments: Pt performed sponge bathing, dressing and grooming sit<>stand at sink, assist for putting hair in pony tail only.     Vision Baseline Vision/History: No visual deficits Patient Visual  Report: No change from baseline       Perception     Praxis      Pertinent Vitals/Pain Pain Assessment: No/denies pain     Hand Dominance Left (by default)   Extremity/Trunk Assessment Upper Extremity Assessment Upper Extremity Assessment: RUE deficits/detail RUE Deficits / Details: longstanding hemiparesis, non functional RUE Coordination: decreased fine motor;decreased gross motor   Lower Extremity Assessment Lower Extremity Assessment: Defer to PT evaluation       Communication Communication Communication: No difficulties   Cognition Arousal/Alertness: Awake/alert Behavior During Therapy: WFL for tasks assessed/performed Overall Cognitive Status: Within Functional Limits for tasks assessed                                     General Comments       Exercises     Shoulder Instructions      Home Living Family/patient expects to be discharged to:: Skilled nursing facility                                        Prior Functioning/Environment Level of Independence: Independent with assistive device(s)  Comments: was at rehab center prior to this admission        OT Problem List: Impaired balance (sitting and/or standing)      OT Treatment/Interventions:      OT Goals(Current goals can be found in the care plan section) Acute Rehab OT Goals Patient Stated Goal: returning to snf  OT Frequency:     Barriers to D/C:            Co-evaluation              AM-PAC PT "6 Clicks" Daily Activity     Outcome Measure Help from another person eating meals?: None Help from another person taking care of personal grooming?: A Little Help from another person toileting, which includes using toliet, bedpan, or urinal?: None Help from another person bathing (including washing, rinsing, drying)?: None Help from another person to put on and taking off regular upper body clothing?: None Help from another person to put on and  taking off regular lower body clothing?: None 6 Click Score: 23   End of Session Equipment Utilized During Treatment: Gait belt (hemiwalker)  Activity Tolerance: Patient tolerated treatment well Patient left: in chair;with call bell/phone within reach;with chair alarm set  OT Visit Diagnosis: Hemiplegia and hemiparesis Hemiplegia - Right/Left: Right Hemiplegia - dominant/non-dominant: Dominant Hemiplegia - caused by: Cerebral infarction                Time: 2202-5427 OT Time Calculation (min): 36 min Charges:  OT General Charges $OT Visit: 1 Procedure OT Evaluation $OT Eval Low Complexity: 1 Procedure OT Treatments $Self Care/Home Management : 8-22 mins G-Codes:       Malka So 12/05/2016, 11:34 AM  [phone]

## 2017-01-07 ENCOUNTER — Encounter: Payer: Self-pay | Admitting: Vascular Surgery

## 2017-01-17 ENCOUNTER — Encounter: Payer: Medicare Other | Admitting: Vascular Surgery

## 2018-07-22 IMAGING — NM NM PULMONARY VENT & PERF
16 series · 16 of 16 positions shown · non-contrast
Comparison: Current chest radiograph.

CLINICAL DATA: Respiratory failure and hypoxia.

EXAM:
NUCLEAR MEDICINE VENTILATION - PERFUSION LUNG SCAN
TECHNIQUE: Ventilation images were obtained in multiple projections using
inhaled aerosol Sc-00m DTPA. Perfusion images were obtained in
multiple projections after intravenous injection of Sc-00m MAA.
RADIOPHARMACEUTICALS:  31.3 mCi Rechnetium-77m DTPA aerosol
inhalation and 4.2 mCi Rechnetium-77m MAA IV

[Series 1: ant/post vent · 4.14mm/px · 1 of 1 slices shown (1 of 2)]
[im 1/1]
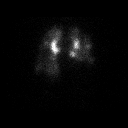

[Series 1: ant/post vent · 4.14mm/px · 1 of 1 slices shown (2 of 2)]
[im 1/1]
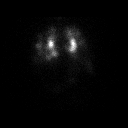

[Series 2: lao/rpo vent · 4.14mm/px · 1 of 1 slices shown (1 of 2)]
[im 1/1]
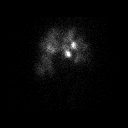

[Series 2: lao/rpo vent · 4.14mm/px · 1 of 1 slices shown (2 of 2)]
[im 1/1]
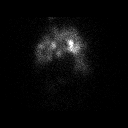

[Series 3: lpo/rao vent · 4.14mm/px · 1 of 1 slices shown (1 of 2)]
[im 1/1]
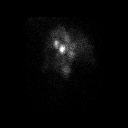

[Series 3: lpo/rao vent · 4.14mm/px · 1 of 1 slices shown (2 of 2)]
[im 1/1]
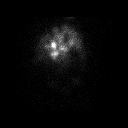

[Series 4: lt lat/rt lat vent · 4.14mm/px · 1 of 1 slices shown (1 of 2)]
[im 1/1]
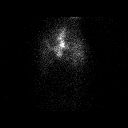

[Series 4: lt lat/rt lat vent · 4.14mm/px · 1 of 1 slices shown (2 of 2)]
[im 1/1]
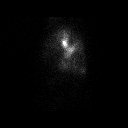

[Series 5: lt lat/rt lat perf · 4.14mm/px · 1 of 1 slices shown (1 of 2)]
[im 1/1]
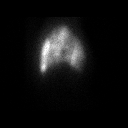

[Series 5: lt lat/rt lat perf · 4.14mm/px · 1 of 1 slices shown (2 of 2)]
[im 1/1]
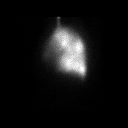

[Series 6: lpo/rao perf · 4.14mm/px · 1 of 1 slices shown (1 of 2)]
[im 1/1]
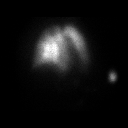

[Series 6: lpo/rao perf · 4.14mm/px · 1 of 1 slices shown (2 of 2)]
[im 1/1]
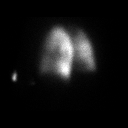

[Series 7: ant/post perf · 4.14mm/px · 1 of 1 slices shown (1 of 2)]
[im 1/1]
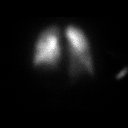

[Series 7: ant/post perf · 4.14mm/px · 1 of 1 slices shown (2 of 2)]
[im 1/1]
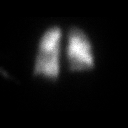

[Series 8: lao/rpo perf · 4.14mm/px · 1 of 1 slices shown (1 of 2)]
[im 1/1]
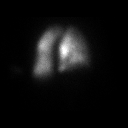

[Series 8: lao/rpo perf · 4.14mm/px · 1 of 1 slices shown (2 of 2)]
[im 1/1]
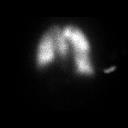

[16 of 16 positions shown; findings below may reference images not displayed]

FINDINGS: Ventilation: There is patchy bilateral lung ventilation similar in
both lungs.

Perfusion: There are nonsegmental areas of relative decreased
profusion in the lungs,. There are no wedge-shaped/segmental areas
of decreased profusion to suggest pulmonary thromboembolism.
IMPRESSION: No evidence of a pulmonary thromboembolism. Patchy areas of
decreased ventilation and lesser degrees of nonsegmental areas of
relative decreased profusion are likely on the basis of airways
disease.

## 2020-06-27 DEATH — deceased
